# Patient Record
Sex: Female | Born: 1935 | Race: White | Hispanic: No | Marital: Married | Smoking: Never smoker
Health system: Southern US, Community
[De-identification: ages and names within clinical notes are randomized; demographics above are authoritative.]

## PROBLEM LIST (undated history)

## (undated) DIAGNOSIS — E119 Type 2 diabetes mellitus without complications: Secondary | ICD-10-CM

## (undated) DIAGNOSIS — I10 Essential (primary) hypertension: Secondary | ICD-10-CM

---

## 2014-02-11 ENCOUNTER — Encounter: Payer: Self-pay | Admitting: Emergency Medicine

## 2014-02-11 ENCOUNTER — Emergency Department
Admission: EM | Admit: 2014-02-11 | Discharge: 2014-02-11 | Disposition: A | Discharge status: Home / Self Care | Payer: 59 | Source: Home / Self Care | Attending: Family Medicine | Admitting: Family Medicine

## 2014-02-11 DIAGNOSIS — R21 Rash and other nonspecific skin eruption: Secondary | ICD-10-CM

## 2014-02-11 HISTORY — DX: Essential (primary) hypertension: I10

## 2014-02-11 HISTORY — DX: Type 2 diabetes mellitus without complications: E11.9

## 2014-02-11 MED ORDER — TRIAMCINOLONE ACETONIDE 0.1 % EX CREA: 1.0000 "application " | TOPICAL_CREAM | Freq: Two times a day (BID) | CUTANEOUS | Status: DC

## 2014-02-11 NOTE — ED Provider Notes (Signed)
Lindsay Young is a 80 y.o. female who presents to Urgent Care today for rash. Patient has a itchy burning rash on her face that's been present for the last 4 days. She denies any new soaps detergents shampoos or cosmetics. She denies any new medications. No mouth involvement. She's tried some over-the-counter creams and Benadryl which have not helped much. No fevers or chills nausea vomiting or diarrhea. She feels well otherwise.   Past Medical History  Diagnosis Date  . Hypertension   . Diabetes mellitus without complication    History  Substance Use Topics  . Smoking status: Never Smoker   . Smokeless tobacco: Never Used  . Alcohol Use: No   ROS as above Medications: No current facility-administered medications for this encounter.   Current Outpatient Prescriptions  Medication Sig Dispense Refill  . amLODipine-benazepril (LOTREL) 5-40 MG per capsule Take 1 capsule by mouth daily.      Marland Kitchen aspirin 81 MG tablet Take 81 mg by mouth daily.      . benazepril (LOTENSIN) 40 MG tablet Take 40 mg by mouth daily.      Marland Kitchen glimepiride (AMARYL) 4 MG tablet Take 4 mg by mouth daily with breakfast.      . simvastatin (ZOCOR) 10 MG tablet Take 10 mg by mouth daily.      Marland Kitchen UNKNOWN TO PATIENT 12 Units. Insulin      . triamcinolone cream (KENALOG) 0.1 % Apply 1 application topically 2 (two) times daily.  30 g  0    Exam:  BP 134/76  Pulse 97  Ht 5\' 9"  (1.753 m)  Wt 152 lb 8 oz (69.174 kg)  BMI 22.51 kg/m2  SpO2 94% Gen: Well NAD HEENT: EOMI,  MMM Lungs: Normal work of breathing. CTABL Heart: RRR no MRG Abd: NABS, Soft. Nondistended, Nontender Exts: Brisk capillary refill, warm and well perfused.  Skin: Erythematous small raised area on face. Appears to be maculopapular. Nontender. Blanchable.   No results found for this or any previous visit (from the past 24 hour(s)). No results found.  Assessment and Plan: 80 y.o. female with rash. Likely contact dermatitis. Plan to treat with  triamcinolone cream and oral antihistamine. If not better followup with primary care provider.  Discussed warning signs or symptoms. Please see discharge instructions. Patient expresses understanding.   This note was created using Systems analyst. Any transcription errors are unintended.    Gregor Hams, MD 02/11/14 [phone]

## 2014-02-11 NOTE — ED Notes (Signed)
Pt complains of red welts on her face that started on Wed.  They have gotten worse and they itch and burn.  Has tried Benadryl with no relief

## 2014-02-11 NOTE — Discharge Instructions (Signed)
Thank you for coming in today. Please take over-the-counter Zyrtec (cetirizine) daily Use triamcinolone cream for the rash twice daily into the skin looks better Followup with your primary care Dr. Jerl Santos back as needed Call or go to the emergency room if you get worse, have trouble breathing, have chest pains, or palpitations.    Contact Dermatitis Contact dermatitis is a reaction to certain substances that touch the skin. Contact dermatitis can be either irritant contact dermatitis or allergic contact dermatitis. Irritant contact dermatitis does not require previous exposure to the substance for a reaction to occur.Allergic contact dermatitis only occurs if you have been exposed to the substance before. Upon a repeat exposure, your body reacts to the substance.  CAUSES  Many substances can cause contact dermatitis. Irritant dermatitis is most commonly caused by repeated exposure to mildly irritating substances, such as:  Makeup.  Soaps.  Detergents.  Bleaches.  Acids.  Metal salts, such as nickel. Allergic contact dermatitis is most commonly caused by exposure to:  Poisonous plants.  Chemicals (deodorants, shampoos).  Jewelry.  Latex.  Neomycin in triple antibiotic cream.  Preservatives in products, including clothing. SYMPTOMS  The area of skin that is exposed may develop:  Dryness or flaking.  Redness.  Cracks.  Itching.  Pain or a burning sensation.  Blisters. With allergic contact dermatitis, there may also be swelling in areas such as the eyelids, mouth, or genitals.  DIAGNOSIS  Your caregiver can usually tell what the problem is by doing a physical exam. In cases where the cause is uncertain and an allergic contact dermatitis is suspected, a patch skin test may be performed to help determine the cause of your dermatitis. TREATMENT Treatment includes protecting the skin from further contact with the irritating substance by avoiding that substance if  possible. Barrier creams, powders, and gloves may be helpful. Your caregiver may also recommend:  Steroid creams or ointments applied 2 times daily. For best results, soak the rash area in cool water for 20 minutes. Then apply the medicine. Cover the area with a plastic wrap. You can store the steroid cream in the refrigerator for a "chilly" effect on your rash. That may decrease itching. Oral steroid medicines may be needed in more severe cases.  Antibiotics or antibacterial ointments if a skin infection is present.  Antihistamine lotion or an antihistamine taken by mouth to ease itching.  Lubricants to keep moisture in your skin.  Burow's solution to reduce redness and soreness or to dry a weeping rash. Mix one packet or tablet of solution in 2 cups cool water. Dip a clean washcloth in the mixture, wring it out a bit, and put it on the affected area. Leave the cloth in place for 30 minutes. Do this as often as possible throughout the day.  Taking several cornstarch or baking soda baths daily if the area is too large to cover with a washcloth. Harsh chemicals, such as alkalis or acids, can cause skin damage that is like a burn. You should flush your skin for 15 to 20 minutes with cold water after such an exposure. You should also seek immediate medical care after exposure. Bandages (dressings), antibiotics, and pain medicine may be needed for severely irritated skin.  HOME CARE INSTRUCTIONS  Avoid the substance that caused your reaction.  Keep the area of skin that is affected away from hot water, soap, sunlight, chemicals, acidic substances, or anything else that would irritate your skin.  Do not scratch the rash. Scratching may cause the  rash to become infected.  You may take cool baths to help stop the itching.  Only take over-the-counter or prescription medicines as directed by your caregiver.  See your caregiver for follow-up care as directed to make sure your skin is healing  properly. SEEK MEDICAL CARE IF:   Your condition is not better after 3 days of treatment.  You seem to be getting worse.  You see signs of infection such as swelling, tenderness, redness, soreness, or warmth in the affected area.  You have any problems related to your medicines. Document Released: 05/30/2000 Document Revised: 08/25/2011 Document Reviewed: 11/05/2010 [facility] Patient Information 2015 [place], [place]. This information is not intended to replace advice given to you by your health care provider. Make sure you discuss any questions you have with your health care provider.

## 2014-05-15 ENCOUNTER — Ambulatory Visit (INDEPENDENT_AMBULATORY_CARE_PROVIDER_SITE_OTHER): Payer: 59 | Admitting: Sports Medicine

## 2014-05-15 ENCOUNTER — Encounter: Payer: Self-pay | Admitting: Emergency Medicine

## 2014-05-15 ENCOUNTER — Emergency Department (INDEPENDENT_AMBULATORY_CARE_PROVIDER_SITE_OTHER): Payer: 59

## 2014-05-15 ENCOUNTER — Emergency Department
Admission: EM | Admit: 2014-05-15 | Discharge: 2014-05-15 | Disposition: A | Discharge status: Home / Self Care | Payer: 59 | Source: Home / Self Care | Attending: Family Medicine | Admitting: Family Medicine

## 2014-05-15 DIAGNOSIS — M25561 Pain in right knee: Secondary | ICD-10-CM

## 2014-05-15 DIAGNOSIS — W19XXXA Unspecified fall, initial encounter: Secondary | ICD-10-CM

## 2014-05-15 DIAGNOSIS — M25511 Pain in right shoulder: Secondary | ICD-10-CM

## 2014-05-15 DIAGNOSIS — M1711 Unilateral primary osteoarthritis, right knee: Secondary | ICD-10-CM | POA: Insufficient documentation

## 2014-05-15 DIAGNOSIS — S62307A Unspecified fracture of fifth metacarpal bone, left hand, initial encounter for closed fracture: Secondary | ICD-10-CM

## 2014-05-15 DIAGNOSIS — S62303A Unspecified fracture of third metacarpal bone, left hand, initial encounter for closed fracture: Secondary | ICD-10-CM

## 2014-05-15 DIAGNOSIS — S62327A Displaced fracture of shaft of fifth metacarpal bone, left hand, initial encounter for closed fracture: Secondary | ICD-10-CM

## 2014-05-15 DIAGNOSIS — S62325A Displaced fracture of shaft of fourth metacarpal bone, left hand, initial encounter for closed fracture: Secondary | ICD-10-CM

## 2014-05-15 DIAGNOSIS — S43401A Unspecified sprain of right shoulder joint, initial encounter: Secondary | ICD-10-CM

## 2014-05-15 DIAGNOSIS — S62309A Unspecified fracture of unspecified metacarpal bone, initial encounter for closed fracture: Secondary | ICD-10-CM | POA: Insufficient documentation

## 2014-05-15 DIAGNOSIS — S62305A Unspecified fracture of fourth metacarpal bone, left hand, initial encounter for closed fracture: Secondary | ICD-10-CM

## 2014-05-15 DIAGNOSIS — S62323A Displaced fracture of shaft of third metacarpal bone, left hand, initial encounter for closed fracture: Secondary | ICD-10-CM

## 2014-05-15 MED ORDER — OXYCODONE-ACETAMINOPHEN 5-325 MG PO TABS: 1.0000 | ORAL_TABLET | Freq: Three times a day (TID) | ORAL | Status: DC | PRN

## 2014-05-15 NOTE — Progress Notes (Signed)
   Subjective:    I'm seeing this patient as a consultation for:  Dr. Assunta Found  CC: Left hand injury  HPI: This is a very pleasant 80 year old female, unfortunately she was knocked over by accident and fell onto an outstretched left hand. She injured her right shoulder, x-rays were negative, she also injured her right knee which showed pre-existing arthritis on the x-rays but no new fractures. Most importantly she did end up with fractures, spiral through the third, fourth, and fifth metacarpal shafts. I was called for further evaluation and definitive treatment. She does have a ring on her fourth finger which needs to be removed as the swelling is worsening. Pain is severe, persistent.  Past medical history, Surgical history, Family history not pertinant except as noted below, Social history, Allergies, and medications have been entered into the medical record, reviewed, and no changes needed.   Review of Systems: No headache, visual changes, nausea, vomiting, diarrhea, constipation, dizziness, abdominal pain, skin rash, fevers, chills, night sweats, weight loss, swollen lymph nodes, body aches, joint swelling, muscle aches, chest pain, shortness of breath, mood changes, visual or auditory hallucinations.   Objective:   General: Well Developed, well nourished, and in no acute distress.  Neuro/Psych: Alert and oriented x3, extra-ocular muscles intact, able to move all 4 extremities, sensation grossly intact. Skin: Warm and dry, no rashes noted.  Respiratory: Not using accessory muscles, speaking in full sentences, trachea midline.  Cardiovascular: Pulses palpable, no extremity edema. Abdomen: Does not appear distended. Right shoulder: Limited abduction suggestive of rotator cuff tear. Left hand: Swollen, bruised with tenderness over the dorsum and volar aspect. Neurovascularly intact distally.    Ring removal procedure:  After applying lubricant and wrapping 4-0 suture material around her ring  finger I was able to pull the suture material under her ring with a curved hemostat, then using gentle axial traction and pulling on the suture material I was able to remove the ring.  X-rays personally reviewed and shows spiral fractures that are non-angulated and only minimally displaced through the third, fourth, and fifth metacarpal shafts.  I placed an ulnar gutter splint.  Impression and Recommendations:   This case required medical decision making of moderate complexity.

## 2014-05-15 NOTE — Assessment & Plan Note (Signed)
Possible rotator cuff injury. We will evaluate this further at her next visit.

## 2014-05-15 NOTE — ED Provider Notes (Signed)
CSN: [id number]     Arrival date & time 05/15/14  1019 History   First MD Initiated Contact with Patient 05/15/14 1121     Chief Complaint  Patient presents with  . Fall      HPI Comments: At 3pm yesterday, patient fell, breaking her fall with her hands.  She also landed on her right shoulder and right knee.  She has had persistent pain and swelling in her left hand, and pain and decreased range of motion in her right shoulder.  She has had minimal discomfort in her right knee.  Patient is a 80 y.o. female presenting with hand injury. The history is provided by the patient.  Hand Injury Location:  Hand Time since incident:  1 day Injury: yes   Mechanism of injury: fall   Fall:    Impact surface:  Hard floor   Point of impact:  Hands (right shoulder and right knee)   Entrapped after fall: no   Hand location:  L hand Pain details:    Quality:  Aching   Radiates to:  Does not radiate   Severity:  Severe   Onset quality:  Sudden   Duration:  1 day   Timing:  Constant   Progression:  Unchanged Chronicity:  New Dislocation: no   Prior injury to area:  No Relieved by:  Nothing Exacerbated by: grasping with left hand and moving right shoulder. Ineffective treatments:  NSAIDs Associated symptoms: decreased range of motion, stiffness and swelling   Associated symptoms: no back pain, no muscle weakness, no neck pain, no numbness and no tingling     Past Medical History  Diagnosis Date  . Hypertension   . Diabetes mellitus without complication    History reviewed. No pertinent past surgical history. No family history on file. History  Substance Use Topics  . Smoking status: Never Smoker   . Smokeless tobacco: Never Used  . Alcohol Use: No   OB History    No data available     Review of Systems  Musculoskeletal: Positive for stiffness. Negative for back pain and neck pain.  All other systems reviewed and are negative.   Allergies  Hydrocodone; Keflex; and  Sulfanilamide  Home Medications   Prior to Admission medications   Medication Sig Start Date End Date Taking? Authorizing Provider  amLODipine-benazepril (LOTREL) 5-40 MG per capsule Take 1 capsule by mouth daily.    Historical Provider, MD  aspirin 81 MG tablet Take 81 mg by mouth daily.    Historical Provider, MD  benazepril (LOTENSIN) 40 MG tablet Take 40 mg by mouth daily.    Historical Provider, MD  glimepiride (AMARYL) 4 MG tablet Take 4 mg by mouth daily with breakfast.    Historical Provider, MD  oxyCODONE-acetaminophen (PERCOCET/ROXICET) 5-325 MG per tablet Take 1 tablet by mouth every 8 (eight) hours as needed. 05/15/14   Silverio Decamp, MD  simvastatin (ZOCOR) 10 MG tablet Take 10 mg by mouth daily.    Historical Provider, MD  triamcinolone cream (KENALOG) 0.1 % Apply 1 application topically 2 (two) times daily. 02/11/14   Gregor Hams, MD  UNKNOWN TO PATIENT 12 Units. Insulin    Historical Provider, MD   BP 116/71 mmHg  Pulse 88  Temp(Src) 98 F (36.7 C) (Oral)  Ht 5\' 10"  (1.778 m)  Wt 154 lb (69.854 kg)  BMI 22.10 kg/m2  SpO2 97% Physical Exam  Constitutional: She is oriented to person, place, and time. She appears well-developed and well-nourished.  No distress.  HENT:  Head: Atraumatic.  Eyes: Conjunctivae are normal. Pupils are equal, round, and reactive to light.  Neck: Normal range of motion.  Cardiovascular: Normal heart sounds.   Pulmonary/Chest: Breath sounds normal.  Abdominal: There is no tenderness.  Musculoskeletal:       Right shoulder: She exhibits decreased range of motion, tenderness, bony tenderness, pain and decreased strength. She exhibits no swelling, no effusion, no crepitus, no deformity, no laceration and normal pulse.       Right knee: She exhibits swelling, ecchymosis and bony tenderness. She exhibits normal range of motion, no deformity, no laceration, no erythema, normal alignment, no LCL laxity and normal patellar mobility. Tenderness  found. Patellar tendon tenderness noted.       Arms:      Right hand: She exhibits decreased range of motion, tenderness, bony tenderness and swelling. She exhibits normal two-point discrimination, normal capillary refill and no laceration. Normal sensation noted. Decreased strength noted.       Hands: Unable to abduct right shoulder more than 45 degrees from vertical.  Decreased external rotation and strength.  Positive Apley's test.  Negative empty can.  Right knee has mild tenderness over patella and patellar tendon.  Eccymosis present below patella medially.  Knee stable (negative drawer).  Negative McMurray test.  Left hand has diffuse swelling and tenderness dorsally as noted on diagram.  There is also ecchymosis and tenderness in the palm of the left hand.    Neurological: She is alert and oriented to person, place, and time.  Skin: Skin is warm and dry.  Nursing note and vitals reviewed.   ED Course  Procedures   none   Imaging Review   Imaging Results       DG Shoulder Right (Final result) Result time: 05/15/14 12:34:23   Final result by Rad Results In Interface (05/15/14 12:34:23)   Narrative:   CLINICAL DATA: Status post fall 1 day ago. Anterior and lateral right shoulder pain. Initial encounter.  EXAM: RIGHT SHOULDER - 2+ VIEW  COMPARISON: None.  FINDINGS: There is no acute bony or joint abnormality. No notable degenerative change is seen.  IMPRESSION: Negative right shoulder.   Electronically Signed By: Inge Rise M.D. On: 05/15/2014 12:34          DG Hand Complete Left (Final result) Result time: 05/15/14 12:37:34   Final result by Rad Results In Interface (05/15/14 12:37:34)   Narrative:   CLINICAL DATA: Left hand pain with bruising and swelling over the second through fifth metacarpals status post fall yesterday.  EXAM: LEFT HAND - COMPLETE 3+ VIEW  COMPARISON: None.  FINDINGS: The patient has sustained spiral  fractures of the third, fourth, and fifth metacarpals. There is mild displacement of the fourth metacarpal shaft fracture. The first and second metacarpals are intact. The phalanges are intact as are the carpal bones and visualized portions of the distal radius and ulna. There is extensive soft tissue swelling over the metacarpals. There is narrowing of the joint spaces diffusely consistent with mild osteoarthritis. There is moderate degenerative change of the first carpometacarpal joint.  IMPRESSION: There are acute spiral fractures of the shafts of the third, fourth, and fifth metacarpals.   Electronically Signed By: David Martinique On: 05/15/2014 12:37          DG Knee Complete 4 Views Right (Final result) Result time: 05/15/14 12:38:32   Final result by Rad Results In Interface (05/15/14 12:38:32)   Narrative:   CLINICAL DATA: Anterior knee pain  status post fall yesterday  EXAM: RIGHT KNEE - COMPLETE 4+ VIEW  COMPARISON: None.  FINDINGS: The bones of the right knee are adequately mineralized. There is mild narrowing of the medial joint compartment. The femoral condyles and the tibial plateaus are intact. Are small spurs from the superior and inferior margins of the patella. There is no joint effusion. There is calcification in the wall of the popliteal artery.  IMPRESSION: There is no acute bony abnormality of the right knee.   Electronically Signed By: David Martinique On: 05/15/2014 12:38      MDM   1. Fracture of fifth metacarpal bone of left hand, closed, initial encounter   2. Right anterior knee pain   3. Fracture of fourth metacarpal bone of left hand, closed, initial encounter   4. Fracture of third metacarpal bone of left hand, closed, initial encounter   5. Shoulder sprain, right, initial encounter    Will refer to Dr. Aundria Mems for definitive fracture management, and management of shoulder injury (suspect rotator cuff  injury)    Kandra Nicolas, MD 05/18/14 [id number]

## 2014-05-15 NOTE — Assessment & Plan Note (Signed)
Ulnar gutter splint placed. Low-dose oxycodone for pain.  We had to perform a ring removal. Return in one week, repeat x-rays.  I billed a fracture code for this encounter, all subsequent visits will be post-op checks in the global period.

## 2014-05-15 NOTE — Assessment & Plan Note (Signed)
We can evaluate this further at future visits.
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
## 2014-05-18 NOTE — Discharge Instructions (Signed)
Ensure adequate Vitamin D and calcium intake

## 2014-05-22 ENCOUNTER — Ambulatory Visit (INDEPENDENT_AMBULATORY_CARE_PROVIDER_SITE_OTHER): Payer: 59 | Admitting: Sports Medicine

## 2014-05-22 ENCOUNTER — Encounter: Payer: Self-pay | Admitting: Sports Medicine

## 2014-05-22 ENCOUNTER — Ambulatory Visit (INDEPENDENT_AMBULATORY_CARE_PROVIDER_SITE_OTHER): Payer: 59

## 2014-05-22 VITALS — BP 134/66 | HR 94 | Ht 68.0 in | Wt 155.0 lb

## 2014-05-22 DIAGNOSIS — S62307D Unspecified fracture of fifth metacarpal bone, left hand, subsequent encounter for fracture with routine healing: Secondary | ICD-10-CM

## 2014-05-22 DIAGNOSIS — S62303D Unspecified fracture of third metacarpal bone, left hand, subsequent encounter for fracture with routine healing: Secondary | ICD-10-CM

## 2014-05-22 DIAGNOSIS — S62309D Unspecified fracture of unspecified metacarpal bone, subsequent encounter for fracture with routine healing: Secondary | ICD-10-CM

## 2014-05-22 DIAGNOSIS — S62305D Unspecified fracture of fourth metacarpal bone, left hand, subsequent encounter for fracture with routine healing: Secondary | ICD-10-CM

## 2014-05-22 DIAGNOSIS — S62309A Unspecified fracture of unspecified metacarpal bone, initial encounter for closed fracture: Secondary | ICD-10-CM

## 2014-05-22 NOTE — Assessment & Plan Note (Signed)
1 week post fracture. New ulnar gutter splint placed. Return in one week, x-ray before visit, if swelling has improved we will place a cast.

## 2014-05-22 NOTE — Progress Notes (Signed)
  Subjective: 1 week post fractures of the left third, fourth, and fifth metacarpals. Doing well in ulnar gutter splint.   Objective: General: Well-developed, well-nourished, and in no acute distress. Left hand: Continues to be swollen and bruised, neurovascularly intact distally, obvious pain over the metacarpal shaft.  New ulnar gutter splint placed today.  Assessment/plan:

## 2014-05-26 ENCOUNTER — Encounter: Payer: Self-pay | Admitting: Sports Medicine

## 2014-05-26 ENCOUNTER — Ambulatory Visit (INDEPENDENT_AMBULATORY_CARE_PROVIDER_SITE_OTHER): Payer: 59 | Admitting: Sports Medicine

## 2014-05-26 ENCOUNTER — Ambulatory Visit (INDEPENDENT_AMBULATORY_CARE_PROVIDER_SITE_OTHER): Payer: 59

## 2014-05-26 VITALS — BP 123/62 | HR 67 | Wt 153.0 lb

## 2014-05-26 DIAGNOSIS — S62307D Unspecified fracture of fifth metacarpal bone, left hand, subsequent encounter for fracture with routine healing: Secondary | ICD-10-CM

## 2014-05-26 DIAGNOSIS — S62309D Unspecified fracture of unspecified metacarpal bone, subsequent encounter for fracture with routine healing: Secondary | ICD-10-CM

## 2014-05-26 DIAGNOSIS — S62305D Unspecified fracture of fourth metacarpal bone, left hand, subsequent encounter for fracture with routine healing: Secondary | ICD-10-CM

## 2014-05-26 DIAGNOSIS — S62303D Unspecified fracture of third metacarpal bone, left hand, subsequent encounter for fracture with routine healing: Secondary | ICD-10-CM

## 2014-05-26 MED ORDER — OXYCODONE-ACETAMINOPHEN 10-325 MG PO TABS: 1.0000 | ORAL_TABLET | Freq: Three times a day (TID) | ORAL | Status: DC | PRN

## 2014-05-26 NOTE — Progress Notes (Signed)
  Subjective: This is a very pleasant 80 year old female who is 1.5 weeks post fractures of the left third, fourth, and fifth metacarpals. Unfortunately she is unable to tolerate her ulnar gutter splint  Objective: General: Well-developed, well-nourished, and in no acute distress. Left hand: Splint is removed, she still has some swelling over the thenar eminence however swelling over the mid hand is minimal. There is still some bruising, and she is neurovascularly intact distally.  Assessment/plan:

## 2014-05-26 NOTE — Assessment & Plan Note (Signed)
Doing well with fracture.  Switched to a Velcro wrist brace that she was unable to tolerate the ulnar gutter splint, and she is not yet ready for a cast. Return to see me in 1.5 weeks.

## 2014-06-06 ENCOUNTER — Ambulatory Visit (INDEPENDENT_AMBULATORY_CARE_PROVIDER_SITE_OTHER): Payer: 59 | Admitting: Sports Medicine

## 2014-06-06 ENCOUNTER — Encounter: Payer: Self-pay | Admitting: Sports Medicine

## 2014-06-06 DIAGNOSIS — S62309D Unspecified fracture of unspecified metacarpal bone, subsequent encounter for fracture with routine healing: Secondary | ICD-10-CM

## 2014-06-06 NOTE — Progress Notes (Signed)
  Subjective: Lindsay Young he returns, she is almost 4 weeks post fractures of her left third, fourth, and fifth metacarpals, she is doing well in a simple Velcro brace.   Objective: General: Well-developed, well-nourished, and in no acute distress. Left hand: Tender to palpation over the fracture site as expected, she is neurovascularly intact distally.  Assessment/plan:

## 2014-06-06 NOTE — Assessment & Plan Note (Signed)
Continues to do well after fracture.  I do anticipate a solid 6-8 weeks for healing. Follow up in 2 weeks, x-ray before visit.

## 2014-06-20 ENCOUNTER — Ambulatory Visit (INDEPENDENT_AMBULATORY_CARE_PROVIDER_SITE_OTHER): Payer: Medicare Other

## 2014-06-20 ENCOUNTER — Encounter: Payer: Self-pay | Admitting: Sports Medicine

## 2014-06-20 ENCOUNTER — Ambulatory Visit (INDEPENDENT_AMBULATORY_CARE_PROVIDER_SITE_OTHER): Payer: Medicare Other | Admitting: Sports Medicine

## 2014-06-20 DIAGNOSIS — S62607D Fracture of unspecified phalanx of left little finger, subsequent encounter for fracture with routine healing: Secondary | ICD-10-CM

## 2014-06-20 DIAGNOSIS — S62309D Unspecified fracture of unspecified metacarpal bone, subsequent encounter for fracture with routine healing: Secondary | ICD-10-CM

## 2014-06-20 DIAGNOSIS — S62603D Fracture of unspecified phalanx of left middle finger, subsequent encounter for fracture with routine healing: Secondary | ICD-10-CM

## 2014-06-20 DIAGNOSIS — S62605D Fracture of unspecified phalanx of left ring finger, subsequent encounter for fracture with routine healing: Secondary | ICD-10-CM

## 2014-06-20 NOTE — Progress Notes (Signed)
  Subjective: 6 weeks post left third, fourth, and fifth spiral metacarpal fractures. Doing significantly better than before. Still has moderate pain at the fracture site.  Objective: General: Well-developed, well-nourished, and in no acute distress. Left hand: Neurovascularly intact distally, minimal tenderness to palpation of the fracture site, good movement.  X-rays continue to show stability of the fractures.  Assessment/plan:

## 2014-06-20 NOTE — Assessment & Plan Note (Signed)
Fractures are proceeding as expected. We are going to start hand therapy for passive range of motion. Discontinue brace.

## 2014-06-27 ENCOUNTER — Ambulatory Visit (INDEPENDENT_AMBULATORY_CARE_PROVIDER_SITE_OTHER): Payer: Medicare Other | Admitting: Physical Therapy

## 2014-06-27 DIAGNOSIS — S62309D Unspecified fracture of unspecified metacarpal bone, subsequent encounter for fracture with routine healing: Secondary | ICD-10-CM

## 2014-06-27 DIAGNOSIS — R5381 Other malaise: Secondary | ICD-10-CM

## 2014-06-27 DIAGNOSIS — M2550 Pain in unspecified joint: Secondary | ICD-10-CM

## 2014-06-27 DIAGNOSIS — M2560 Stiffness of unspecified joint, not elsewhere classified: Secondary | ICD-10-CM

## 2014-06-29 ENCOUNTER — Encounter (INDEPENDENT_AMBULATORY_CARE_PROVIDER_SITE_OTHER): Payer: Medicare Other | Admitting: Physical Therapy

## 2014-06-29 DIAGNOSIS — S62309D Unspecified fracture of unspecified metacarpal bone, subsequent encounter for fracture with routine healing: Secondary | ICD-10-CM

## 2014-06-29 DIAGNOSIS — M2560 Stiffness of unspecified joint, not elsewhere classified: Secondary | ICD-10-CM

## 2014-06-29 DIAGNOSIS — M2550 Pain in unspecified joint: Secondary | ICD-10-CM

## 2014-06-29 DIAGNOSIS — R5381 Other malaise: Secondary | ICD-10-CM

## 2014-07-03 ENCOUNTER — Encounter (INDEPENDENT_AMBULATORY_CARE_PROVIDER_SITE_OTHER): Payer: Medicare Other | Admitting: Physical Therapy

## 2014-07-03 DIAGNOSIS — R5381 Other malaise: Secondary | ICD-10-CM

## 2014-07-03 DIAGNOSIS — M2550 Pain in unspecified joint: Secondary | ICD-10-CM

## 2014-07-03 DIAGNOSIS — M2560 Stiffness of unspecified joint, not elsewhere classified: Secondary | ICD-10-CM

## 2014-07-03 DIAGNOSIS — S62309D Unspecified fracture of unspecified metacarpal bone, subsequent encounter for fracture with routine healing: Secondary | ICD-10-CM

## 2014-07-05 ENCOUNTER — Encounter: Payer: Medicare Other | Admitting: Physical Therapy

## 2014-07-06 ENCOUNTER — Encounter (INDEPENDENT_AMBULATORY_CARE_PROVIDER_SITE_OTHER): Payer: Medicare Other | Admitting: Physical Therapy

## 2014-07-06 DIAGNOSIS — M2560 Stiffness of unspecified joint, not elsewhere classified: Secondary | ICD-10-CM

## 2014-07-06 DIAGNOSIS — S62309D Unspecified fracture of unspecified metacarpal bone, subsequent encounter for fracture with routine healing: Secondary | ICD-10-CM

## 2014-07-06 DIAGNOSIS — M2550 Pain in unspecified joint: Secondary | ICD-10-CM

## 2014-07-06 DIAGNOSIS — R5381 Other malaise: Secondary | ICD-10-CM

## 2014-07-07 ENCOUNTER — Encounter: Payer: Medicare Other | Admitting: Physical Therapy

## 2014-07-11 ENCOUNTER — Encounter: Payer: Self-pay | Admitting: Sports Medicine

## 2014-07-11 ENCOUNTER — Ambulatory Visit (INDEPENDENT_AMBULATORY_CARE_PROVIDER_SITE_OTHER): Payer: Medicare Other | Admitting: Sports Medicine

## 2014-07-11 ENCOUNTER — Encounter (INDEPENDENT_AMBULATORY_CARE_PROVIDER_SITE_OTHER): Payer: Medicare Other | Admitting: Physical Therapy

## 2014-07-11 ENCOUNTER — Ambulatory Visit (INDEPENDENT_AMBULATORY_CARE_PROVIDER_SITE_OTHER): Payer: Medicare Other

## 2014-07-11 VITALS — BP 117/58 | HR 81 | Wt 150.0 lb

## 2014-07-11 DIAGNOSIS — R5381 Other malaise: Secondary | ICD-10-CM

## 2014-07-11 DIAGNOSIS — S62309D Unspecified fracture of unspecified metacarpal bone, subsequent encounter for fracture with routine healing: Secondary | ICD-10-CM

## 2014-07-11 DIAGNOSIS — M2550 Pain in unspecified joint: Secondary | ICD-10-CM

## 2014-07-11 DIAGNOSIS — M19042 Primary osteoarthritis, left hand: Secondary | ICD-10-CM

## 2014-07-11 DIAGNOSIS — S62323D Displaced fracture of shaft of third metacarpal bone, left hand, subsequent encounter for fracture with routine healing: Secondary | ICD-10-CM

## 2014-07-11 DIAGNOSIS — M2560 Stiffness of unspecified joint, not elsewhere classified: Secondary | ICD-10-CM

## 2014-07-11 NOTE — Progress Notes (Signed)
  Subjective:    CC: Follow-up  HPI: Metacarpal fractures: Lindsay Young is about 11-12 weeks post fractures of the left third, fourth, and fifth metacarpals, she has healed well, and is currently regaining motion in therapy.  Second metacarpal phalangeal joint pain: Moderate, persistent, osteoarthritis on x-rays. Pain has been persistent and not improved with immobilization. No radiation.  Past medical history, Surgical history, Family history not pertinant except as noted below, Social history, Allergies, and medications have been entered into the medical record, reviewed, and no changes needed.   Review of Systems: No fevers, chills, night sweats, weight loss, chest pain, or shortness of breath.   Objective:    General: Well Developed, well nourished, and in no acute distress.  Neuro: Alert and oriented x3, extra-ocular muscles intact, sensation grossly intact.  HEENT: Normocephalic, atraumatic, pupils equal round reactive to light, neck supple, no masses, no lymphadenopathy, thyroid nonpalpable.  Skin: Warm and dry, no rashes. Cardiac: Regular rate and rhythm, no murmurs rubs or gallops, no lower extremity edema.  Respiratory: Clear to auscultation bilaterally. Not using accessory muscles, speaking in full sentences. Left hand: No longer tender to palpation of the fractures, she does have significant swelling and tenderness at the metacarpophalangeal joint, second.  X-rays reviewed and do show continued healing of the spiral fracture through the third, fourth, and fifth metacarpals without angulation or displacement.  Procedure: Real-time Ultrasound Guided Injection of left second metacarpal phalangeal joint Device: GE Logiq E  Verbal informed consent obtained.  Time-out conducted.  Noted no overlying erythema, induration, or other signs of local infection.  Skin prepped in a sterile fashion.  Local anesthesia: Topical Ethyl chloride.  With sterile technique and under real time  ultrasound guidance:  Using a 30-gauge needle I injected 0.5 mL kenalog 40, 0.5 mL lidocaine easily. Completed without difficulty  Pain immediately resolved suggesting accurate placement of the medication.  Advised to call if fevers/chills, erythema, induration, drainage, or persistent bleeding.  Images permanently stored and available for review in the ultrasound unit.  Impression: Technically successful ultrasound guided injection.  Impression and Recommendations:

## 2014-07-11 NOTE — Assessment & Plan Note (Signed)
Left second metacarpal phalangeal joint injection as above. Return in one month

## 2014-07-11 NOTE — Assessment & Plan Note (Signed)
Overall doing extremely well, no longer tender over the fractures. Continues to improve her range of motion with hand therapy.

## 2014-07-14 ENCOUNTER — Encounter (INDEPENDENT_AMBULATORY_CARE_PROVIDER_SITE_OTHER): Payer: Medicare Other | Admitting: Physical Therapy

## 2014-07-14 DIAGNOSIS — M2550 Pain in unspecified joint: Secondary | ICD-10-CM

## 2014-07-14 DIAGNOSIS — S62309D Unspecified fracture of unspecified metacarpal bone, subsequent encounter for fracture with routine healing: Secondary | ICD-10-CM

## 2014-07-14 DIAGNOSIS — R5381 Other malaise: Secondary | ICD-10-CM

## 2014-07-14 DIAGNOSIS — M2560 Stiffness of unspecified joint, not elsewhere classified: Secondary | ICD-10-CM

## 2014-07-18 ENCOUNTER — Encounter (INDEPENDENT_AMBULATORY_CARE_PROVIDER_SITE_OTHER): Payer: Medicare Other | Admitting: Physical Therapy

## 2014-07-18 DIAGNOSIS — S62309D Unspecified fracture of unspecified metacarpal bone, subsequent encounter for fracture with routine healing: Secondary | ICD-10-CM

## 2014-07-18 DIAGNOSIS — R5381 Other malaise: Secondary | ICD-10-CM

## 2014-07-18 DIAGNOSIS — M2550 Pain in unspecified joint: Secondary | ICD-10-CM

## 2014-07-18 DIAGNOSIS — M2560 Stiffness of unspecified joint, not elsewhere classified: Secondary | ICD-10-CM

## 2014-07-21 ENCOUNTER — Encounter (INDEPENDENT_AMBULATORY_CARE_PROVIDER_SITE_OTHER): Payer: Medicare Other | Admitting: Physical Therapy

## 2014-07-21 DIAGNOSIS — M2560 Stiffness of unspecified joint, not elsewhere classified: Secondary | ICD-10-CM

## 2014-07-21 DIAGNOSIS — M2550 Pain in unspecified joint: Secondary | ICD-10-CM

## 2014-07-21 DIAGNOSIS — R5381 Other malaise: Secondary | ICD-10-CM

## 2014-07-21 DIAGNOSIS — S62309D Unspecified fracture of unspecified metacarpal bone, subsequent encounter for fracture with routine healing: Secondary | ICD-10-CM

## 2014-07-25 ENCOUNTER — Encounter (INDEPENDENT_AMBULATORY_CARE_PROVIDER_SITE_OTHER): Payer: Medicare Other | Admitting: Physical Therapy

## 2014-07-25 DIAGNOSIS — S62309D Unspecified fracture of unspecified metacarpal bone, subsequent encounter for fracture with routine healing: Secondary | ICD-10-CM

## 2014-07-25 DIAGNOSIS — M2550 Pain in unspecified joint: Secondary | ICD-10-CM

## 2014-07-25 DIAGNOSIS — R5381 Other malaise: Secondary | ICD-10-CM

## 2014-07-25 DIAGNOSIS — M2560 Stiffness of unspecified joint, not elsewhere classified: Secondary | ICD-10-CM

## 2014-07-27 ENCOUNTER — Encounter: Payer: Medicare Other | Admitting: Physical Therapy

## 2014-07-28 ENCOUNTER — Encounter: Payer: Medicare Other | Admitting: Physical Therapy

## 2014-08-01 ENCOUNTER — Ambulatory Visit (INDEPENDENT_AMBULATORY_CARE_PROVIDER_SITE_OTHER): Payer: Medicare Other | Admitting: Physical Therapy

## 2014-08-01 DIAGNOSIS — Z789 Other specified health status: Secondary | ICD-10-CM

## 2014-08-01 DIAGNOSIS — M2560 Stiffness of unspecified joint, not elsewhere classified: Secondary | ICD-10-CM

## 2014-08-01 DIAGNOSIS — M2550 Pain in unspecified joint: Secondary | ICD-10-CM

## 2014-08-01 DIAGNOSIS — IMO0001 Reserved for inherently not codable concepts without codable children: Secondary | ICD-10-CM

## 2014-08-01 NOTE — Therapy (Signed)
[place], [place], [address] Phone: [phone]   Fax:  [phone]  Physical Therapy Treatment  Patient Details  Name: Lindsay Young MRN: [id number] Date of Birth: [birth year] Referring Provider:  Silverio Decamp,*  Encounter Date: 08/01/2014      PT End of Session - 08/01/14 1143    Visit Number 11   Number of Visits 12   Date for PT Re-Evaluation 08/08/14   PT Start Time 1100   PT Stop Time 1140   PT Time Calculation (min) 40 min   Equipment Utilized During Treatment --  Ultrasound       Past Medical History  Diagnosis Date  . Hypertension   . Diabetes mellitus without complication     No past surgical history on file.  There were no vitals taken for this visit.  Visit Diagnosis:  Pain in joint involving multiple sites  Stiffness of joints, multiple sites  Activity of daily living alteration      Subjective Assessment - 08/01/14 1112    Symptoms soreness and tightness always   Currently in Pain? Yes   Pain Score 1    Pain Location Finger (Comment which one)   Pain Orientation Left   Pain Descriptors / Indicators Dull   Pain Type Chronic pain   Pain Onset More than a month ago   Aggravating Factors  ther ex and certain movements   Pain Relieving Factors rest, NSAIDS, Arnica Cream                    [facility] Adult PT Treatment/Exercise - 08/01/14 0001    Exercises   Exercises Wrist   Wrist Exercises   Bar Weights/Barbell (Forearm Supination) 2 lbs   Forearm Pronation 10 reps   Bar Weights/Barbell (Forearm Pronation) 2 lbs   Wrist Flexion 10 reps   Bar Weights/Barbell (Wrist Flexion) 1 lb   Wrist Extension 10 reps   Bar Weights/Barbell (Wrist Extension) 2 lbs   Bar Weights/Barbell (Radial Deviation) 2 lbs   Wrist Ulnar Deviation 10 reps   Bar Weights/Barbell (Ulnar Deviation) 2 lbs   Modalities   Modalities Ultrasound   Ultrasound   Ultrasound  Location 4 digits execept thumb   Ultrasound Parameters 0.5 wcm2,8 min. 50%, 3.3 MHZ   Manual Therapy   Manual Therapy Passive ROM   Passive ROM stretching each digit except thumb into flexion and extension                     PT Long Term Goals - 08/01/14 1148    PT LONG TERM GOAL #1   Title demonstate and / or verbalize techniques to reduce the risk of re-injury to include infor on anit -inflammatory (RICE method). Met 08/01/14   PT LONG TERM GOAL #2   Title be independent with advanced HEP for strength and ROM met 08/01/14   PT LONG TERM GOAL #3   Title report pain decrease to 1/10 or less with ADLS met 10/21/30   PT LONG TERM GOAL #4   Title demo left hand ROM WFL to don/doff clothing met 08/01/14   PT LONG TERM GOAL #5   Title improve FOTO =/< 37% limited   Additional Long Term Goals   Additional Long Term Goals Yes   PT LONG TERM GOAL #6   Title hold onto to treadmill safely while walking               Plan -  08/01/14 1145    Clinical Impression Statement Patient doing well with pain decreasing and increased ROM for writst flexion, wrist extension is progressing slowly   PT Frequency 2x / week   PT Duration 6 weeks   PT Next Visit Plan --  ROM, Assess for renewal or D/C        Problem List Patient Active Problem List   Diagnosis Date Noted  . Osteoarthritis of hand, left 07/11/2014  . Osteoarthritis of right knee 05/15/2014  . Right shoulder pain 05/15/2014  . Closed fracture of third, fourth, and fifth metacarpals of left hand 05/15/2014     Natividad Brood, PTA 08/01/2014, 12:05 PM  [facility] [place], [place], [address] Phone: [phone]   Fax:  [phone]

## 2014-08-07 ENCOUNTER — Ambulatory Visit (INDEPENDENT_AMBULATORY_CARE_PROVIDER_SITE_OTHER): Payer: Medicare Other | Admitting: Physical Therapy

## 2014-08-07 DIAGNOSIS — M2560 Stiffness of unspecified joint, not elsewhere classified: Secondary | ICD-10-CM

## 2014-08-07 DIAGNOSIS — Z789 Other specified health status: Secondary | ICD-10-CM

## 2014-08-07 DIAGNOSIS — IMO0001 Reserved for inherently not codable concepts without codable children: Secondary | ICD-10-CM

## 2014-08-07 DIAGNOSIS — M2550 Pain in unspecified joint: Secondary | ICD-10-CM

## 2014-08-07 NOTE — Therapy (Signed)
[place], [place], [address] Phone: [phone]   Fax:  [phone]  Physical Therapy Treatment  Patient Details  Name: Lindsay Young MRN: [id number] Date of Birth: 05/10/1931 Referring Provider:  Silverio Decamp,*  Encounter Date: 08/07/2014      PT End of Session - 08/07/14 1559    Visit Number 12   Number of Visits 12   Date for PT Re-Evaluation 08/08/14   PT Start Time 2035   PT Stop Time 1600   PT Time Calculation (min) 45 min   Activity Tolerance Patient tolerated treatment well      Past Medical History  Diagnosis Date  . Hypertension   . Diabetes mellitus without complication     No past surgical history on file.  There were no vitals taken for this visit.  Visit Diagnosis:  Pain in joint involving multiple sites  Stiffness of joints, multiple sites  Activity of daily living alteration      Subjective Assessment - 08/07/14 1554    Symptoms always hurts alittle. some aching intermittently. No meds over the past few days.           [facility] PT Assessment - 08/07/14 0001    Observation/Other Assessments   Focus on Therapeutic Outcomes (FOTO)  56%   ROM / Strength   AROM / PROM / Strength Strength   AROM   AROM Assessment Site Finger;Wrist   Right/Left Wrist Left   Left Wrist Extension 50 Degrees   Left Wrist Flexion 53 Degrees   Left Wrist Radial Deviation 10 Degrees   Left Wrist Ulnar Deviation 20 Degrees   Right/Left Finger Left   Left Composite Finger Extension --  2 MCP = 65degrees, 3rd & 4th MCP = 68 degrees, 5th MCP = 72   Strength   Overall Strength --  Wrist Flexion and Extension 5-/5   Strength Assessment Site Hand   Right/Left hand Left   Grip (lbs) 10   Left Hand 3 Point Pinch 5 lbs                  [facility] Adult PT Treatment/Exercise - 08/07/14 0001    Exercises   Exercises Wrist;Hand   Wrist Exercises   Bar Weights/Barbell  (Forearm Supination) 3 lbs  15 times   Bar Weights/Barbell (Forearm Pronation) 3 lbs   Wrist Flexion 15 reps;Strengthening  3 #   Bar Weights/Barbell (Wrist Flexion) 3 lbs   Wrist Extension 15 reps   Bar Weights/Barbell (Wrist Extension) 3 lbs   Wrist Radial Deviation 15 reps   Bar Weights/Barbell (Radial Deviation) 3 lbs   Wrist Ulnar Deviation 15 reps   Bar Weights/Barbell (Ulnar Deviation) 3 lbs   Other wrist exercises --  4 digit - Via light tension   Other wrist exercises --  Via light tension 20 times   Manual Therapy   Manual Therapy Passive ROM   Passive ROM --  stretch into flexion and extension                      PT Long Term Goals - 08/01/14 1148    PT LONG TERM GOAL #1   Title demonstate and / or verbalize techniques to reduce the risk of re-injury to include infor on anit -inflammatory (RICE method). Met 08/01/14   PT LONG TERM GOAL #2   Title be independent with advanced HEP for strength and ROM met 08/01/14   PT LONG TERM  GOAL #3   Title report pain decrease to 1/10 or less with ADLS met 06/21/08   PT LONG TERM GOAL #4   Title demo left hand ROM WFL to don/doff clothing met 08/01/14   PT LONG TERM GOAL #5   Title improve FOTO =/< 37% limited   Additional Long Term Goals   Additional Long Term Goals Yes   PT LONG TERM GOAL #6   Title hold onto to treadmill safely while walking               Plan - 08/07/14 1603    Clinical Impression Statement Discussed with PT for D/C to HEP. Patient is doing well with minimal pain with certain movements. She is not taking OTC meds anymore.   Rehab Potential Excellent   PT Frequency 2x / week   PT Duration 6 weeks   PT Treatment/Interventions Moist Heat;Electrical Stimulation;Cryotherapy;Ultrasound;Therapeutic exercise;Passive range of motion;Manual techniques   PT Next Visit Plan D/C this visit   Consulted and Agree with Plan of Care --  discussed D/C with PT.        Problem List Patient Active  Problem List   Diagnosis Date Noted  . Osteoarthritis of hand, left 07/11/2014  . Osteoarthritis of right knee 05/15/2014  . Right shoulder pain 05/15/2014  . Closed fracture of third, fourth, and fifth metacarpals of left hand 05/15/2014   PHYSICAL THERAPY DISCHARGE SUMMARY  Visits from Start of Care: 12  Current functional level related to goals / functional outcomes: See goals above, FOTO 56% limited, has been at this level for 2 wks.   Remaining deficits: As above, however patient reports she is able to perform all activities she wishes to perform   Education / Equipment: HEP Plan: Patient agrees to discharge.  Patient goals were partially met. Patient is being discharged due to                                                     [facility] rehab potential     [place], PT 08/07/2014, 4:53 PM  [facility] [place], [place], [address] Phone: [phone]   Fax:  [phone]

## 2014-08-07 NOTE — Therapy (Signed)
[place], [place], [address] Phone: [phone]   Fax:  [phone]  Physical Therapy Treatment  Patient Details  Name: Lindsay Young MRN: [id number] Date of Birth: 05/10/1931 Referring Provider:  Silverio Decamp,*  Encounter Date: 08/07/2014      PT End of Session - 08/07/14 1559    Visit Number 12   Number of Visits 12   Date for PT Re-Evaluation 08/08/14   PT Start Time 2035   PT Stop Time 1600   PT Time Calculation (min) 45 min   Activity Tolerance Patient tolerated treatment well      Past Medical History  Diagnosis Date  . Hypertension   . Diabetes mellitus without complication     No past surgical history on file.  There were no vitals taken for this visit.  Visit Diagnosis:  Pain in joint involving multiple sites  Stiffness of joints, multiple sites  Activity of daily living alteration      Subjective Assessment - 08/07/14 1554    Symptoms always hurts alittle. some aching intermittently. No meds over the past few days.           [facility] PT Assessment - 08/07/14 0001    Observation/Other Assessments   Focus on Therapeutic Outcomes (FOTO)  56%   ROM / Strength   AROM / PROM / Strength Strength   AROM   AROM Assessment Site Finger;Wrist   Right/Left Wrist Left   Left Wrist Extension 50 Degrees   Left Wrist Flexion 53 Degrees   Left Wrist Radial Deviation 10 Degrees   Left Wrist Ulnar Deviation 20 Degrees   Right/Left Finger Left   Left Composite Finger Extension --  2 MCP = 65degrees, 3rd & 4th MCP = 68 degrees, 5th MCP = 72   Strength   Overall Strength --  Wrist Flexion and Extension 5-/5   Strength Assessment Site Hand   Right/Left hand Left   Grip (lbs) 10   Left Hand 3 Point Pinch 5 lbs                  [facility] Adult PT Treatment/Exercise - 08/07/14 0001    Exercises   Exercises Wrist;Hand   Wrist Exercises   Bar Weights/Barbell  (Forearm Supination) 3 lbs  15 times   Bar Weights/Barbell (Forearm Pronation) 3 lbs   Wrist Flexion 15 reps;Strengthening  3 #   Bar Weights/Barbell (Wrist Flexion) 3 lbs   Wrist Extension 15 reps   Bar Weights/Barbell (Wrist Extension) 3 lbs   Wrist Radial Deviation 15 reps   Bar Weights/Barbell (Radial Deviation) 3 lbs   Wrist Ulnar Deviation 15 reps   Bar Weights/Barbell (Ulnar Deviation) 3 lbs   Other wrist exercises --  4 digit - Via light tension   Other wrist exercises --  Via light tension 20 times   Manual Therapy   Manual Therapy Passive ROM   Passive ROM --  stretch into flexion and extension                      PT Long Term Goals - 08/01/14 1148    PT LONG TERM GOAL #1   Title demonstate and / or verbalize techniques to reduce the risk of re-injury to include infor on anit -inflammatory (RICE method). Met 08/01/14   PT LONG TERM GOAL #2   Title be independent with advanced HEP for strength and ROM met 08/01/14   PT LONG TERM  GOAL #3   Title report pain decrease to 1/10 or less with ADLS met 02/17/23   PT LONG TERM GOAL #4   Title demo left hand ROM WFL to don/doff clothing met 08/01/14   PT LONG TERM GOAL #5   Title improve FOTO =/< 37% limited   Additional Long Term Goals   Additional Long Term Goals Yes   PT LONG TERM GOAL #6   Title hold onto to treadmill safely while walking               Plan - 08/07/14 1603    Clinical Impression Statement Discussed with PT for D/C to HEP. Patient is doing well with minimal pain with certain movements. She is not taking OTC meds anymore.   Rehab Potential Excellent   PT Frequency 2x / week   PT Duration 6 weeks   PT Treatment/Interventions Moist Heat;Electrical Stimulation;Cryotherapy;Ultrasound;Therapeutic exercise;Passive range of motion;Manual techniques   PT Next Visit Plan D/C this visit   Consulted and Agree with Plan of Care --  discussed D/C with PT.        Problem List Patient Active  Problem List   Diagnosis Date Noted  . Osteoarthritis of hand, left 07/11/2014  . Osteoarthritis of right knee 05/15/2014  . Right shoulder pain 05/15/2014  . Closed fracture of third, fourth, and fifth metacarpals of left hand 05/15/2014    Natividad Brood, PTA   08/07/2014, 4:15 PM  [facility] [place], [place], [address] Phone: [phone]   Fax:  [phone]

## 2014-08-08 ENCOUNTER — Encounter: Payer: Self-pay | Admitting: Sports Medicine

## 2014-08-08 ENCOUNTER — Ambulatory Visit (INDEPENDENT_AMBULATORY_CARE_PROVIDER_SITE_OTHER): Payer: Medicare Other | Admitting: Sports Medicine

## 2014-08-08 VITALS — BP 154/72 | HR 102 | Wt 150.0 lb

## 2014-08-08 DIAGNOSIS — E0590 Thyrotoxicosis, unspecified without thyrotoxic crisis or storm: Secondary | ICD-10-CM

## 2014-08-08 DIAGNOSIS — E785 Hyperlipidemia, unspecified: Secondary | ICD-10-CM | POA: Diagnosis not present

## 2014-08-08 DIAGNOSIS — E119 Type 2 diabetes mellitus without complications: Secondary | ICD-10-CM | POA: Diagnosis not present

## 2014-08-08 DIAGNOSIS — R7989 Other specified abnormal findings of blood chemistry: Secondary | ICD-10-CM

## 2014-08-08 DIAGNOSIS — R946 Abnormal results of thyroid function studies: Secondary | ICD-10-CM

## 2014-08-08 DIAGNOSIS — I10 Essential (primary) hypertension: Secondary | ICD-10-CM

## 2014-08-08 DIAGNOSIS — Z0000 Encounter for general adult medical examination without abnormal findings: Secondary | ICD-10-CM | POA: Diagnosis not present

## 2014-08-08 DIAGNOSIS — M19042 Primary osteoarthritis, left hand: Secondary | ICD-10-CM

## 2014-08-08 NOTE — Assessment & Plan Note (Signed)
Fractures are healing well, pain at the second MCP has resolved after injection.

## 2014-08-08 NOTE — Progress Notes (Signed)
  Subjective:    CC: Follow-up  HPI: Multiple metacarpal fractures: Clinically resolved, range of motion and strength is significantly improving with physical therapy.  Second metacarpal phalangeal osteoarthritis: Resolved after intra-articular injection.  Diabetes mellitus type 2: Stable, minimal discussed this at a future visit, she does desire to establish care here.  Benign essential hypertension: Uncontrolled, desires to establish care, we will get her started on a new blood pressure medication most likely.  Hyperlipidemia: Stable on Zocor, does need a recheck of her lipid panel.  Past medical history, Surgical history, Family history not pertinant except as noted below, Social history, Allergies, and medications have been entered into the medical record, reviewed, and no changes needed.   Review of Systems: No fevers, chills, night sweats, weight loss, chest pain, or shortness of breath.   Objective:    General: Well Developed, well nourished, and in no acute distress.  Neuro: Alert and oriented x3, extra-ocular muscles intact, sensation grossly intact.  HEENT: Normocephalic, atraumatic, pupils equal round reactive to light, neck supple, no masses, no lymphadenopathy, thyroid nonpalpable.  Skin: Warm and dry, no rashes. Cardiac: Regular rate and rhythm, no murmurs rubs or gallops, no lower extremity edema.  Respiratory: Clear to auscultation bilaterally. Not using accessory muscles, speaking in full sentences. Left Hand: Minimal tenderness to palpation over the third, fourth, and fifth metacarpal phalangeal joints, she does have good motion, with good flexion at the proximal and distal interphalangeal joints however limited flexion at the metacarpal phalangeal joints, good strength, continues to work with physical therapy.   Impression and Recommendations:

## 2014-08-08 NOTE — Assessment & Plan Note (Signed)
Continue current medication, checking lipids.

## 2014-08-08 NOTE — Assessment & Plan Note (Signed)
Continue current medications, no changes for now.

## 2014-08-08 NOTE — Assessment & Plan Note (Signed)
Uncontrolled, we will check this in further detail at a future visit.

## 2014-08-10 ENCOUNTER — Encounter: Payer: Medicare Other | Admitting: Physical Therapy

## 2014-08-11 ENCOUNTER — Encounter: Payer: Medicare Other | Admitting: Physical Therapy

## 2014-08-19 LAB — COMPREHENSIVE METABOLIC PANEL WITH GFR
ALT: 10 U/L (ref 0–35)
BUN: 24 mg/dL — ABNORMAL HIGH (ref 6–23)
Chloride: 101 meq/L (ref 96–112)
Glucose, Bld: 99 mg/dL (ref 70–99)
Potassium: 4.6 meq/L (ref 3.5–5.3)
Sodium: 140 meq/L (ref 135–145)
Total Protein: 6.9 g/dL (ref 6.0–8.3)

## 2014-08-19 LAB — CBC
HCT: 38.5 % (ref 36.0–46.0)
Hemoglobin: 12.9 g/dL (ref 12.0–15.0)
MCH: 29.9 pg (ref 26.0–34.0)
MCHC: 33.5 g/dL (ref 30.0–36.0)
MCV: 89.3 fL (ref 78.0–100.0)
MPV: 9.3 fL (ref 8.6–12.4)
Platelets: 289 10*3/uL (ref 150–400)
RBC: 4.31 MIL/uL (ref 3.87–5.11)
RDW: 13.8 % (ref 11.5–15.5)
WBC: 6.1 10*3/uL (ref 4.0–10.5)

## 2014-08-19 LAB — LIPID PANEL
Cholesterol: 163 mg/dL (ref 0–200)
HDL: 47 mg/dL (ref >=46)
LDL Cholesterol: 92 mg/dL (ref 0–99)
Total CHOL/HDL Ratio: 3.5 Ratio
Triglycerides: 118 mg/dL (ref <150)
VLDL: 24 mg/dL (ref 0–40)

## 2014-08-19 LAB — COMPREHENSIVE METABOLIC PANEL
AST: 19 U/L (ref 0–37)
Albumin: 4.1 g/dL (ref 3.5–5.2)
Alkaline Phosphatase: 45 U/L (ref 39–117)
CO2: 28 mEq/L (ref 19–32)
Calcium: 10 mg/dL (ref 8.4–10.5)
Creat: 1.48 mg/dL — ABNORMAL HIGH (ref 0.50–1.10)
Total Bilirubin: 0.7 mg/dL (ref 0.2–1.2)

## 2014-08-19 LAB — VITAMIN D 25 HYDROXY (VIT D DEFICIENCY, FRACTURES): Vit D, 25-Hydroxy: 41 ng/mL (ref 30–100)

## 2014-08-19 LAB — HEMOGLOBIN A1C
Hgb A1c MFr Bld: 6.3 % — ABNORMAL HIGH (ref <5.7)
Mean Plasma Glucose: 134 mg/dL — ABNORMAL HIGH (ref <117)

## 2014-08-19 LAB — TSH: TSH: 0.024 u[IU]/mL — ABNORMAL LOW (ref 0.350–4.500)

## 2014-08-19 LAB — VITAMIN B12: Vitamin B-12: 538 pg/mL (ref 211–911)

## 2014-08-21 DIAGNOSIS — E0590 Thyrotoxicosis, unspecified without thyrotoxic crisis or storm: Secondary | ICD-10-CM | POA: Insufficient documentation

## 2014-08-21 NOTE — Addendum Note (Signed)
Addended by: Silverio Decamp on: 08/21/2014 08:16 AM   Modules accepted: Orders

## 2014-08-21 NOTE — Assessment & Plan Note (Signed)
Checking T3 and T4.

## 2014-08-22 LAB — T4, FREE: Free T4: 1.26 ng/dL (ref 0.80–1.80)

## 2014-08-22 LAB — T3, FREE: T3, Free: 2.9 pg/mL (ref 2.3–4.2)

## 2014-08-23 ENCOUNTER — Encounter: Payer: Self-pay | Admitting: Sports Medicine

## 2014-08-23 ENCOUNTER — Ambulatory Visit (INDEPENDENT_AMBULATORY_CARE_PROVIDER_SITE_OTHER): Payer: Medicare Other | Admitting: Sports Medicine

## 2014-08-23 VITALS — BP 114/53 | HR 87 | Wt 149.0 lb

## 2014-08-23 DIAGNOSIS — E785 Hyperlipidemia, unspecified: Secondary | ICD-10-CM | POA: Diagnosis not present

## 2014-08-23 DIAGNOSIS — Z23 Encounter for immunization: Secondary | ICD-10-CM

## 2014-08-23 DIAGNOSIS — I10 Essential (primary) hypertension: Secondary | ICD-10-CM

## 2014-08-23 DIAGNOSIS — E119 Type 2 diabetes mellitus without complications: Secondary | ICD-10-CM

## 2014-08-23 DIAGNOSIS — Z0000 Encounter for general adult medical examination without abnormal findings: Secondary | ICD-10-CM | POA: Diagnosis not present

## 2014-08-23 MED ORDER — METFORMIN HCL 1000 MG PO TABS: 1000.0000 mg | ORAL_TABLET | Freq: Two times a day (BID) | ORAL | Status: DC

## 2014-08-23 MED ORDER — SIMVASTATIN 10 MG PO TABS: 10.0000 mg | ORAL_TABLET | Freq: Every day | ORAL | Status: DC

## 2014-08-23 MED ORDER — GLIMEPIRIDE 4 MG PO TABS: 4.0000 mg | ORAL_TABLET | Freq: Two times a day (BID) | ORAL | Status: DC

## 2014-08-23 NOTE — Assessment & Plan Note (Signed)
Currently taking insulin, she does have a few lows. She has 4 weeks left of insulin, I would like to discontinue this, and we will restart glimepiride and metformin, creatinine was 1.48, we will simply keep an eye on this.

## 2014-08-23 NOTE — Assessment & Plan Note (Signed)
Well controlled, no changes 

## 2014-08-23 NOTE — Assessment & Plan Note (Signed)
Up-to-date on most of her screening measures, tetanus and pneumococcal 13 given today. Ordering DEXA scan. Return in 4 months to recheck A1c, and likely for Medicare physical.

## 2014-08-23 NOTE — Progress Notes (Signed)
  Subjective:    CC: Follow-up  HPI: Diabetes mellitus type 2: Currently on insulin, recent A1c was 6.3, would like to come off of insulin if able.  Hypertension: Well controlled  Hyperlipidemia: Well controlled  Subclinical hyperthyroidism: Low TSH but T3 and T4 were normal. Asymptomatic.  Past medical history, Surgical history, Family history not pertinant except as noted below, Social history, Allergies, and medications have been entered into the medical record, reviewed, and no changes needed.   Review of Systems: No fevers, chills, night sweats, weight loss, chest pain, or shortness of breath.   Objective:    General: Well Developed, well nourished, and in no acute distress.  Neuro: Alert and oriented x3, extra-ocular muscles intact, sensation grossly intact.  HEENT: Normocephalic, atraumatic, pupils equal round reactive to light, neck supple, no masses, no lymphadenopathy, thyroid nonpalpable.  Skin: Warm and dry, no rashes. Cardiac: Regular rate and rhythm, no murmurs rubs or gallops, no lower extremity edema.  Respiratory: Clear to auscultation bilaterally. Not using accessory muscles, speaking in full sentences.  Diabetic Foot Exam Both feet were examined, there are no signs of ulceration or abnormal callus. Nails are unremarkable. Dorsalis pedis and posterior tibial pulses are palpable. Sensation is intact to sharp and monofilament. Shoes are of appropriate fitment.  Impression and Recommendations:

## 2014-08-23 NOTE — Assessment & Plan Note (Signed)
Extremely well controlled, refilling simvastatin.

## 2014-08-23 NOTE — Progress Notes (Deleted)

## 2014-08-29 ENCOUNTER — Other Ambulatory Visit: Payer: Self-pay | Admitting: *Deleted

## 2014-08-29 MED ORDER — BENAZEPRIL HCL 40 MG PO TABS: 40.0000 mg | ORAL_TABLET | Freq: Every day | ORAL | Status: DC

## 2014-08-30 ENCOUNTER — Other Ambulatory Visit: Payer: Medicare Other

## 2014-09-06 ENCOUNTER — Ambulatory Visit (INDEPENDENT_AMBULATORY_CARE_PROVIDER_SITE_OTHER): Payer: Medicare Other

## 2014-09-06 DIAGNOSIS — M859 Disorder of bone density and structure, unspecified: Secondary | ICD-10-CM

## 2014-12-08 ENCOUNTER — Telehealth: Payer: Self-pay

## 2014-12-08 DIAGNOSIS — E119 Type 2 diabetes mellitus without complications: Secondary | ICD-10-CM

## 2014-12-08 MED ORDER — GLIMEPIRIDE 4 MG PO TABS: 4.0000 mg | ORAL_TABLET | Freq: Two times a day (BID) | ORAL | Status: DC

## 2014-12-08 NOTE — Telephone Encounter (Signed)
Patient request refill for Glimepiride 4 mg, #180 3 Refills were sent to [facility]. Rhonda Cunningham,CMA

## 2014-12-25 ENCOUNTER — Ambulatory Visit (INDEPENDENT_AMBULATORY_CARE_PROVIDER_SITE_OTHER): Payer: Medicare Other | Admitting: Sports Medicine

## 2014-12-25 ENCOUNTER — Encounter: Payer: Self-pay | Admitting: Sports Medicine

## 2014-12-25 VITALS — BP 127/67 | HR 106 | Ht 68.0 in | Wt 138.0 lb

## 2014-12-25 DIAGNOSIS — M25532 Pain in left wrist: Secondary | ICD-10-CM

## 2014-12-25 DIAGNOSIS — E119 Type 2 diabetes mellitus without complications: Secondary | ICD-10-CM

## 2014-12-25 DIAGNOSIS — E785 Hyperlipidemia, unspecified: Secondary | ICD-10-CM | POA: Diagnosis not present

## 2014-12-25 DIAGNOSIS — Z0000 Encounter for general adult medical examination without abnormal findings: Secondary | ICD-10-CM

## 2014-12-25 DIAGNOSIS — I10 Essential (primary) hypertension: Secondary | ICD-10-CM | POA: Diagnosis not present

## 2014-12-25 LAB — POCT GLYCOSYLATED HEMOGLOBIN (HGB A1C): Hemoglobin A1C: 6.4

## 2014-12-25 NOTE — Assessment & Plan Note (Signed)
At this point we have discontinued her insulin, and we have cut her metformin and glyburide down to once daily, hemoglobin A1c was 6.4. No changes. No further hypoglycemia. Foot exam done today.

## 2014-12-25 NOTE — Progress Notes (Signed)
Subjective:    Lindsay Young is a 81 y.o. female who presents for Medicare Annual/Subsequent preventive examination.  Preventive Screening-Counseling & Management  Tobacco History  Smoking status  . Never Smoker   Smokeless tobacco  . Never Used     Problems Prior to Visit 1. Diabetes mellitus type 2: Off of all insulin now, we have decreased metformin and Amaryl once daily in the morning.  2. Hypertension: Controlled  3. Hyperlipidemia: Stable and controlled  Current Problems (verified) Patient Active Problem List   Diagnosis Date Noted  . Subclinical hyperthyroidism 08/21/2014  . Diabetes mellitus type 2 in nonobese 08/08/2014  . Essential hypertension, benign 08/08/2014  . Hyperlipidemia 08/08/2014  . Annual physical exam 08/08/2014  . Osteoarthritis of hand, left 07/11/2014  . Osteoarthritis of right knee 05/15/2014  . Right shoulder pain 05/15/2014  . Closed fracture of third, fourth, and fifth metacarpals of left hand 05/15/2014    Medications Prior to Visit Current Outpatient Prescriptions on File Prior to Visit  Medication Sig Dispense Refill  . amLODipine-benazepril (LOTREL) 5-40 MG per capsule Take by mouth daily. 1/2 daily    . aspirin 81 MG tablet Take 81 mg by mouth daily.    . benazepril (LOTENSIN) 40 MG tablet Take 1 tablet (40 mg total) by mouth daily. 90 tablet 1  . glimepiride (AMARYL) 4 MG tablet Take 1 tablet (4 mg total) by mouth 2 (two) times daily. 180 tablet 3  . metFORMIN (GLUCOPHAGE) 1000 MG tablet Take 1 tablet (1,000 mg total) by mouth 2 (two) times daily with a meal. 180 tablet 3  . oxyCODONE-acetaminophen (PERCOCET) 10-325 MG per tablet Take 1 tablet by mouth every 8 (eight) hours as needed for pain. (Patient taking differently: Take 1 tablet by mouth daily. ) 30 tablet 0  . simvastatin (ZOCOR) 10 MG tablet Take 1 tablet (10 mg total) by mouth daily. 90 tablet 3   No current facility-administered medications on file prior to visit.     Current Medications (verified) Current Outpatient Prescriptions  Medication Sig Dispense Refill  . amLODipine-benazepril (LOTREL) 5-40 MG per capsule Take by mouth daily. 1/2 daily    . aspirin 81 MG tablet Take 81 mg by mouth daily.    . benazepril (LOTENSIN) 40 MG tablet Take 1 tablet (40 mg total) by mouth daily. 90 tablet 1  . glimepiride (AMARYL) 4 MG tablet Take 1 tablet (4 mg total) by mouth 2 (two) times daily. 180 tablet 3  . metFORMIN (GLUCOPHAGE) 1000 MG tablet Take 1 tablet (1,000 mg total) by mouth 2 (two) times daily with a meal. 180 tablet 3  . oxyCODONE-acetaminophen (PERCOCET) 10-325 MG per tablet Take 1 tablet by mouth every 8 (eight) hours as needed for pain. (Patient taking differently: Take 1 tablet by mouth daily. ) 30 tablet 0  . simvastatin (ZOCOR) 10 MG tablet Take 1 tablet (10 mg total) by mouth daily. 90 tablet 3   No current facility-administered medications for this visit.     Allergies (verified) Hydrocodone; Keflex; and Sulfanilamide   PAST HISTORY  Family History History reviewed. No pertinent family history.  Social History History  Substance Use Topics  . Smoking status: Never Smoker   . Smokeless tobacco: Never Used  . Alcohol Use: No     Are there smokers in your home (other than you)? No  Risk Factors Current exercise habits: The patient does not participate in regular exercise at present.  Dietary issues discussed: No   Cardiac risk factors: advanced  age (older than 42 for men, 63 for women), diabetes mellitus, hypertension and sedentary lifestyle.  Depression Screen (Note: if answer to either of the following is "Yes", a more complete depression screening is indicated)   Over the past two weeks, have you felt down, depressed or hopeless? No  Over the past two weeks, have you felt little interest or pleasure in doing things? No  Have you lost interest or pleasure in daily life? No  Do you often feel hopeless? No  Do you cry  easily over simple problems? No  Activities of Daily Living In your present state of health, do you have any difficulty performing the following activities?:  Driving? No Managing money?  No Feeding yourself? No Getting from bed to chair? NoNo exam performed today, no indicated. Climbing a flight of stairs? No Preparing food and eating?: No Bathing or showering? No Getting dressed: No Getting to the toilet? No Using the toilet:No Moving around from place to place: No In the past year have you fallen or had a near fall?:No   Are you sexually active?  No  Do you have more than one partner?  No  Hearing Difficulties: No Do you often ask people to speak up or repeat themselves? No Do you experience ringing or noises in your ears? No Do you have difficulty understanding soft or whispered voices? No   Do you feel that you have a problem with memory? No  Do you often misplace items? No  Do you feel safe at home?  Yes  Cognitive Testing  Alert? Yes  Normal Appearance?Yes  Oriented to person? Yes  Place? Yes   Time? Yes  Recall of three objects?  Yes  Can perform simple calculations? Yes  Displays appropriate judgment?Yes  Can read the correct time from a watch face?Yes   Advanced Directives have been discussed with the patient? Yes  List the Names of Other Physician/Practitioners you currently use: 1.    Indicate any recent Medical Services you may have received from other than [facility] providers in the past year (date may be approximate).  Immunization History  Administered Date(s) Administered  . Pneumococcal Conjugate-13 08/23/2014  . Tdap 08/23/2014    Screening Tests Health Maintenance  Topic Date Due  . OPHTHALMOLOGY EXAM  09/15/2014  . INFLUENZA VACCINE  01/15/2015  . HEMOGLOBIN A1C  02/18/2015  . FOOT EXAM  08/23/2015  . PNA vac Low Risk Adult (2 of 2 - PPSV23) 08/23/2015  . COLONOSCOPY  06/17/2023  . TETANUS/TDAP  08/22/2024  . DEXA SCAN  Completed  .  ZOSTAVAX  Addressed    All answers were reviewed with the patient and necessary referrals were made:  Aundria Mems, MD   12/25/2014   History reviewed: allergies, current medications, past family history, past medical history, past social history, past surgical history and problem list  Review of Systems A comprehensive review of systems was negative.    Objective:     Body mass index is 20.99 kg/(m^2). BP 127/67 mmHg  Pulse 106  Ht 5\' 8"  (1.727 m)  Wt 138 lb (62.596 kg)  BMI 20.99 kg/m2 General: Well Developed, well nourished, and in no acute distress.  Neuro: Alert and oriented x3, extra-ocular muscles intact, sensation grossly intact. Cranial nerves II through XII are intact, motor, sensory, and coordinative functions are all intact. HEENT: Normocephalic, atraumatic, pupils equal round reactive to light, neck supple, no masses, no lymphadenopathy, thyroid nonpalpable. Oropharynx, nasopharynx, external ear canals are unremarkable. Skin: Warm and  dry, no rashes noted.  Cardiac: Regular rate and rhythm, no murmurs rubs or gallops.  Respiratory: Clear to auscultation bilaterally. Not using accessory muscles, speaking in full sentences.  Abdominal: Soft, nontender, nondistended, positive bowel sounds, no masses, no organomegaly.  Left Wrist: Inspection normal with no visible erythema or swelling. ROM smooth and normal with good flexion and extension and ulnar/radial deviation that is symmetrical with opposite wrist. Tender to palpation over the radiocarpal joint dorsally No snuffbox tenderness. No tenderness over Canal of [place]. Strength 5/5 in all directions without pain. Negative Finkelstein, tinel's and phalens. Negative Watson's test.      Assessment:     1. Healthy female     Plan:     During the course of the visit the patient was educated and counseled about appropriate screening and preventive services including:    Diabetes preventive measures  Diet  review for nutrition referral? Yes ____  Not Indicated __x__   Patient Instructions (the written plan) was given to the patient.  Medicare Attestation I have personally reviewed: The patient's medical and social history Their use of alcohol, tobacco or illicit drugs Their current medications and supplements The patient's functional ability including ADLs,fall risks, home safety risks, cognitive, and hearing and visual impairment Diet and physical activities Evidence for depression or mood disorders  The patient's weight, height, BMI, and visual acuity have been recorded in the chart.  I have made referrals, counseling, and provided education to the patient based on review of the above and I have provided the patient with a written personalized care plan for preventive services.     Aundria Mems, MD   12/25/2014

## 2014-12-25 NOTE — Assessment & Plan Note (Signed)
There is also an element of numbness and tingling predominantly nocturnal. This likely represents tunnel syndrome. Nighttime splint, rehabilitation exercises, return as needed.

## 2014-12-25 NOTE — Assessment & Plan Note (Signed)
Medicare physical today. Up-to-date on measures, she does have an ophthalmologist visit coming up next month.

## 2014-12-25 NOTE — Assessment & Plan Note (Signed)
Stable and controlled, no changes

## 2014-12-25 NOTE — Assessment & Plan Note (Signed)
Controlled, no changes. 

## 2015-02-15 ENCOUNTER — Other Ambulatory Visit: Payer: Self-pay | Admitting: Sports Medicine

## 2015-03-09 ENCOUNTER — Encounter: Payer: Self-pay | Admitting: Sports Medicine

## 2015-03-09 ENCOUNTER — Ambulatory Visit (INDEPENDENT_AMBULATORY_CARE_PROVIDER_SITE_OTHER): Payer: Medicare Other | Admitting: Sports Medicine

## 2015-03-09 VITALS — BP 122/41 | HR 84 | Ht 67.0 in | Wt 133.0 lb

## 2015-03-09 DIAGNOSIS — M720 Palmar fascial fibromatosis [Dupuytren]: Secondary | ICD-10-CM | POA: Diagnosis not present

## 2015-03-09 DIAGNOSIS — Z23 Encounter for immunization: Secondary | ICD-10-CM

## 2015-03-09 DIAGNOSIS — M1711 Unilateral primary osteoarthritis, right knee: Secondary | ICD-10-CM

## 2015-03-09 MED ORDER — TRAMADOL HCL 50 MG PO TABS: ORAL_TABLET | ORAL | Status: DC

## 2015-03-09 NOTE — Assessment & Plan Note (Signed)
Mild and patient is able to put both palms flat on the table.

## 2015-03-09 NOTE — Assessment & Plan Note (Signed)
With an acute injury, likely a degenerative meniscal tear after twisting her right knee. She is very tender along the posterior medial joint line. Injection, knee brace, return in one month. Tramadol for pain

## 2015-03-09 NOTE — Progress Notes (Signed)
  Subjective:    CC: Right Knee pain  HPI: This is a pleasant 81 year old female, she has a history of right knee osteoarthritis, several days ago she twisted her knee, she had immediate pain that she localizes at the posterior medial joint line, no mechanical symptoms, pain is worst with terminal flexion.  Pain is severe, she has great difficulty bearing weight.  Past medical history, Surgical history, Family history not pertinant except as noted below, Social history, Allergies, and medications have been entered into the medical record, reviewed, and no changes needed.   Review of Systems: No fevers, chills, night sweats, weight loss, chest pain, or shortness of breath.   Objective:    General: Well Developed, well nourished, and in no acute distress.  Neuro: Alert and oriented x3, extra-ocular muscles intact, sensation grossly intact.  HEENT: Normocephalic, atraumatic, pupils equal round reactive to light, neck supple, no masses, no lymphadenopathy, thyroid nonpalpable.  Skin: Warm and dry, no rashes. Cardiac: Regular rate and rhythm, no murmurs rubs or gallops, no lower extremity edema.  Respiratory: Clear to auscultation bilaterally. Not using accessory muscles, speaking in full sentences. Right Knee: Normal to inspection with no erythema or effusion or obvious bony abnormalities. Tender to palpation at the medial joint line ROM normal in flexion and extension and lower leg rotation. Ligaments with solid consistent endpoints including ACL, PCL, LCL, MCL. Negative Mcmurray's and provocative meniscal tests. Non painful patellar compression. Patellar and quadriceps tendons unremarkable. Hamstring and quadriceps strength is normal.  Procedure: Real-time Ultrasound Guided Injection of right knee Device: GE Logiq E  Verbal informed consent obtained.  Time-out conducted.  Noted no overlying erythema, induration, or other signs of local infection.  Skin prepped in a sterile fashion.    Local anesthesia: Topical Ethyl chloride.  With sterile technique and under real time ultrasound guidance:  2 mL Kenalog 40, 4 mL lidocaine injected easily into the suprapatellar recess. Completed without difficulty  Pain immediately resolved suggesting accurate placement of the medication.  Advised to call if fevers/chills, erythema, induration, drainage, or persistent bleeding.  Images permanently stored and available for review in the ultrasound unit.  Impression: Technically successful ultrasound guided injection.  Impression and Recommendations:

## 2015-03-09 NOTE — Patient Instructions (Signed)
Dupuytren's Contracture Dupuytren's contracture affects the fingers and the palm of the hand. This condition usually develops slowly. It may take many years to develop. The pinky finger and the ring finger are most often affected. These fingers start to curve inward, like a claw. At some point, the fingers cannot go straight anymore. This can make it hard to do things like:  Put on gloves.  Shake hands.  Grab something off a shelf. The condition usually does not cause pain and is not dangerous. The condition gets its name from the doctor who came up with an operation to fix the problem. His name was Baron Guillaume Dupuytren. Contracture means pulling inward. CAUSES  Dupuytren's contracture does not start with the fingers. It starts in the palm of the hand, under the skin. The tissue under the skin is called fascia. The fascia covers the cords (tendons) that control how the fingers move. In Dupuytren's contracture the fascia tissue becomes thick and then pulls on the cords. That causes the fingers to curl. The condition can affect both hands and any fingers, but it usually strikes one hand worse than the other. The fingers farthest from the thumb are most often the ones that curl. The cause is not clear. Some experts believe it results from an autoimmune reaction. That means the body's immune system (which fights off disease) attacks itself by mistake. What experts do know is that certain conditions and behaviors (called risk factors) make the chance of having this condition more likely. They include:  Age. Most people who have the condition are older than 50.  Sex. It affects men more often than women.  Family history. The condition tends to run in families from countries in [place].  Certain behaviors. People who smoke and drink alcohol are more apt to develop the problem.  Some other medical conditions. Having diabetes makes Dupuytren's contracture more likely. So does  having a condition that involves a seizure (when the brain's function is interrupted). SYMPTOMS  Signs of this condition take time to develop. Sometimes this takes weeks or months. More often, it takes several years.   Early symptoms:  Skin on the palm of the hand becomes thick. This is usually the first sign.  The skin may look dimpled or puckered.  Lumps (nodules) show up on the palm. There may be one or more lumps. They are not painful.  Later symptoms:  Thick cords of tissue form in the palm of the hand.  The pinky and ring fingers start to curl up into the palm.  The fingers cannot be straightened into their normal position. DIAGNOSIS  A physical examination is the main way that a healthcare provider can tell if you have Dupuytren's contracture. Other tests usually are not needed. The caregiver will probably:  Look at your hands. Feel your hands. This is to check for thickening and nodules.  Measure finger motion. This tells how much your fingers have contracted (pulled in).  Do a tabletop test. You will be asked to try to put your hand flat on a table, palm down. TREATMENT  There is no cure for Dupuytren's contracture. But there are ways to treat the symptoms. Options include:  Watching and waiting. The condition develops slowly. Often it does not create problems for a long time. Sometimes the skin gets thick and nodules form, but the fingers never curl. So, in some cases it is best to just watch the condition carefully and wait to see what happens.    Shots (injections). Different substances may be injected, including:  Steroids. These drugs block swelling. These shots should make the condition less uncomfortable. Steroids may also slow down the condition. Shots are given into the nodules. The effect only lasts awhile. More shots may have to be given.  Enzymes. These are proteins. They weaken the thick tissue. After an injection, the caregiver usually stretches the  fingers.  Needling. A needle is pushed through the skin and into the thick tissue. This is done in several spots. The goal is to break up the thickened tissue. Or to weaken it.  Surgery. This may be suggested if you cannot grasp objects. Or, if you can no longer put your hand in your pocket.  A cut (incision) is made in the palm of the hand. The thick tissue is removed.  Sometimes the thick tissue is attached to the skin. Then, the skin must be removed, too. It is replaced with a piece of skin from another place on your body. That is called a skin graft.  Occupational or hand therapy is almost always needed after surgery. This involves special exercises to get back the use of your hand and fingers. After a skin graft, several months of therapy may be needed.  Sometimes the condition comes back, even after surgery.  Other methods. You can do some things on your own. They include:  Stretching the fingers backwards. Do this often.  Warming the hand and massaging it. Again, do this often.  Using tools with padded grips. This should make things easier.  Wearing heavy gloves while working. This protects the hands. PROGNOSIS  Dupuytren's contracture usually develops slowly. There is no cure. But, the symptoms can be treated. Sometimes they come back after treatment, but not always. It is important to remember that this is a functional problem and not a life-threatening condition. Document Released: 03/30/2009 Document Revised: 08/25/2011 Document Reviewed: 03/30/2009 Exit[employer] Patient Information 2015 [employer]. This information is not intended to replace advice given to you by your health care provider. Make sure you discuss any questions you have with your health care provider.  

## 2015-04-09 ENCOUNTER — Ambulatory Visit (INDEPENDENT_AMBULATORY_CARE_PROVIDER_SITE_OTHER): Payer: Medicare Other | Admitting: Sports Medicine

## 2015-04-09 ENCOUNTER — Encounter: Payer: Self-pay | Admitting: Sports Medicine

## 2015-04-09 DIAGNOSIS — E119 Type 2 diabetes mellitus without complications: Secondary | ICD-10-CM

## 2015-04-09 DIAGNOSIS — E785 Hyperlipidemia, unspecified: Secondary | ICD-10-CM | POA: Diagnosis not present

## 2015-04-09 DIAGNOSIS — M1711 Unilateral primary osteoarthritis, right knee: Secondary | ICD-10-CM

## 2015-04-09 DIAGNOSIS — E0590 Thyrotoxicosis, unspecified without thyrotoxic crisis or storm: Secondary | ICD-10-CM

## 2015-04-09 NOTE — Progress Notes (Signed)
  Subjective:    CC: follow-up  HPI: Lindsay Young returns, she is a pleasant 81 year old female, I injected her right knee at the last visit and she returns with pain completely resolved, she is agreeable to do aggressive formal physical therapy to prevent recurrence.  Fatigue: Very nonspecific, she does have a history subclinical hyperthyroidism, history of depression in the distant past but she denies any current depressive symptoms. She tends to spend a great real-time sitting on the couch and her husband who accompanies her today thinks that we need to increase her physical activity level, she has lost approximately 30 pounds over the past several months, but we did discontinue her insulin at a previous visit, insulin is weight positive.her previous hemoglobin A1c was wonderfully controlled off of insulin, and switching her simply to oral hypoglycemic agents.  Past medical history, Surgical history, Family history not pertinant except as noted below, Social history, Allergies, and medications have been entered into the medical record, reviewed, and no changes needed.   Review of Systems: No fevers, chills, night sweats, weight loss, chest pain, or shortness of breath.   Objective:    General: Well Developed, well nourished, and in no acute distress.  Neuro: Alert and oriented x3, extra-ocular muscles intact, sensation grossly intact.  HEENT: Normocephalic, atraumatic, pupils equal round reactive to light, neck supple, no masses, no lymphadenopathy, thyroid nonpalpable.  Skin: Warm and dry, no rashes. Cardiac: Regular rate and rhythm, no murmurs rubs or gallops, no lower extremity edema.  Respiratory: Clear to auscultation bilaterally. Not using accessory muscles, speaking in full sentences. Right Knee: Normal to inspection with no erythema or effusion or obvious bony abnormalities. Palpation normal with no warmth or joint line tenderness or patellar tenderness or condyle tenderness. ROM normal  in flexion and extension and lower leg rotation. Ligaments with solid consistent endpoints including ACL, PCL, LCL, MCL. Negative Mcmurray's and provocative meniscal tests. Non painful patellar compression. Patellar and quadriceps tendons unremarkable. Hamstring and quadriceps strength is normal.  Impression and Recommendations:

## 2015-04-09 NOTE — Assessment & Plan Note (Addendum)
Completely resolved after injection at the last visit.  Adding aggressive formal PT

## 2015-04-09 NOTE — Assessment & Plan Note (Signed)
Doing well on oral medications. Rechecking A1c. She will hold her Amaryl prior to her fasting blood work tomorrow morning

## 2015-04-09 NOTE — Assessment & Plan Note (Signed)
With significant fatigue. Rechecking TSH, T3, T4. No symptoms of depression.

## 2015-04-09 NOTE — Assessment & Plan Note (Signed)
Rechecking lipids. 

## 2015-04-10 LAB — CBC
HCT: 33.6 % — ABNORMAL LOW (ref 36.0–46.0)
Hemoglobin: 11.7 g/dL — ABNORMAL LOW (ref 12.0–15.0)
MCH: 29.1 pg (ref 26.0–34.0)
MCHC: 34.8 g/dL (ref 30.0–36.0)
MCV: 83.6 fL (ref 78.0–100.0)
MPV: 8.6 fL (ref 8.6–12.4)
Platelets: 353 10*3/uL (ref 150–400)
RBC: 4.02 MIL/uL (ref 3.87–5.11)
RDW: 13.3 % (ref 11.5–15.5)
WBC: 7.1 K/uL (ref 4.0–10.5)

## 2015-04-10 LAB — LIPID PANEL
Cholesterol: 142 mg/dL (ref 125–200)
HDL: 41 mg/dL — ABNORMAL LOW (ref >=46)
LDL Cholesterol: 72 mg/dL (ref <130)
Total CHOL/HDL Ratio: 3.5 ratio (ref <=5.0)
Triglycerides: 145 mg/dL (ref <150)
VLDL: 29 mg/dL (ref <30)

## 2015-04-10 LAB — COMPREHENSIVE METABOLIC PANEL
ALT: 8 U/L (ref 6–29)
Alkaline Phosphatase: 62 U/L (ref 33–130)
BUN: 20 mg/dL (ref 7–25)
CO2: 23 mmol/L (ref 20–31)
Creat: 1.3 mg/dL — ABNORMAL HIGH (ref 0.60–0.88)
Potassium: 4.8 mmol/L (ref 3.5–5.3)
Sodium: 136 mmol/L (ref 135–146)
Total Bilirubin: 0.6 mg/dL (ref 0.2–1.2)
Total Protein: 6.7 g/dL (ref 6.1–8.1)

## 2015-04-10 LAB — COMPREHENSIVE METABOLIC PANEL WITH GFR
AST: 19 U/L (ref 10–35)
Albumin: 3.8 g/dL (ref 3.6–5.1)
Calcium: 10.3 mg/dL (ref 8.6–10.4)
Chloride: 99 mmol/L (ref 98–110)
Glucose, Bld: 82 mg/dL (ref 65–99)

## 2015-04-10 LAB — TSH: TSH: 0.016 u[IU]/mL — ABNORMAL LOW (ref 0.350–4.500)

## 2015-04-10 LAB — HEMOGLOBIN A1C
Hgb A1c MFr Bld: 6.2 % — ABNORMAL HIGH (ref <5.7)
Mean Plasma Glucose: 131 mg/dL — ABNORMAL HIGH (ref <117)

## 2015-04-10 LAB — T3, FREE: T3, Free: 2.8 pg/mL (ref 2.3–4.2)

## 2015-04-10 LAB — T4, FREE: Free T4: 1.22 ng/dL (ref 0.80–1.80)

## 2015-04-12 ENCOUNTER — Ambulatory Visit (INDEPENDENT_AMBULATORY_CARE_PROVIDER_SITE_OTHER): Payer: Medicare Other | Admitting: Physical Therapy

## 2015-04-12 ENCOUNTER — Ambulatory Visit: Payer: Medicare Other | Admitting: Physical Therapy

## 2015-04-12 ENCOUNTER — Encounter: Payer: Self-pay | Admitting: Physical Therapy

## 2015-04-12 DIAGNOSIS — R269 Unspecified abnormalities of gait and mobility: Secondary | ICD-10-CM

## 2015-04-12 DIAGNOSIS — M25661 Stiffness of right knee, not elsewhere classified: Secondary | ICD-10-CM | POA: Diagnosis not present

## 2015-04-12 DIAGNOSIS — R531 Weakness: Secondary | ICD-10-CM | POA: Diagnosis not present

## 2015-04-12 NOTE — Patient Instructions (Signed)
Strengthening: Straight Leg Raise (Phase 1)    [place] [phone]    Tighten muscles on front of right thigh, then lift leg _10-12___ inches from surface, keeping knee locked.  Repeat _20___ times per set. Do __1__ sets per session. Do __1__ sessions per day.   Strengthening: Hip Extension (Prone)    Tighten muscles on front of right thigh, then lift leg _8-10___ inches from surface, keeping knee locked. Repeat __20__ times per set. Do _1___ sets per session. Do __1__ sessions per day.  Strengthening: Hip Abduction (Side-Lying)    Tighten muscles on front of right thigh, then lift leg _10-12___ inches from surface, keeping knee locked.  Repeat _20___ times per set. Do _1___ sets per session. Do _1___ sessions per day.  Quads / HF, Yoga Half Bow    Lie face down, use a strap/sheet or belt around foot. Pull heel towards the bottom to feel a stretch on the front of your thigh. Hold _30__ seconds. Repeat on the other leg.  Repeat _2__ times per session. Do _1__ sessions per day.  Strengthening: Quadriceps Set        You can lie down for this    Tighten muscles on top of thighs by pushing knees down into surface. Hold __5__ seconds. Repeat _10___ times per set. Do __1__ sets per session. Do __1__ sessions per day.

## 2015-04-12 NOTE — Therapy (Signed)
[facility], [place], [address] Phone: [phone]   Fax:  [phone]  Physical Therapy Evaluation  Patient Details  Name: Lindsay Young MRN: [id number] Date of Birth: 06/25/1930 Referring Provider: Dr Dianah Field  Encounter Date: 04/12/2015      PT End of Session - 04/12/15 1010    Visit Number 1   Number of Visits 8   Date for PT Re-Evaluation 05/10/15   PT Start Time 0934   PT Stop Time 1011   PT Time Calculation (min) 37 min   Activity Tolerance Patient tolerated treatment well      Past Medical History  Diagnosis Date  . Hypertension   . Diabetes mellitus without complication ([place])     History reviewed. No pertinent past surgical history.  There were no vitals filed for this visit.  Visit Diagnosis:  Weakness generalized - Plan: PT plan of care cert/re-cert  Abnormality of gait - Plan: PT plan of care cert/re-cert  Stiffness of knee joint, right - Plan: PT plan of care cert/re-cert      Subjective Assessment - 04/12/15 0938    Subjective Pt reports that about a month ago while getting into the car developed pain in the Rt knee, had an injection the day after and this  helped.  WAs using a walker, stopped last week however still limps around.    Pertinent History Pt reports that her Kt knee feels like it gives out.    How long can you walk comfortably? doing some community ambulation.    Patient Stated Goals walk up 3 steps in the garage., walk on her treadmill, was walking a mile.    Currently in Pain? No/denies            [facility] PT Assessment - 04/12/15 0001    Assessment   Medical Diagnosis Rt knee OA   Referring Provider Dr Dianah Field   Onset Date/Surgical Date 03/13/15   Next MD Visit 05/08/15   Prior Therapy not for this   Precautions   Precautions None   Required Braces or Orthoses --  wearing knee compression brace during the day.    Balance Screen   Has  the patient fallen in the past 6 months No   Has the patient had a decrease in activity level because of a fear of falling?  No   Is the patient reluctant to leave their home because of a fear of falling?  No   Home Environment   Living Environment Private residence   Home Access Stairs to enter  3 steps from the garage   Prior Function   Level of Independence Independent   Vocation Retired   Leisure read   Observation/Other Assessments   Focus on Therapeutic Outcomes (FOTO)  59% limited   Functional Tests   Functional tests Single leg stance   Single Leg Stance   Comments Rt 6 sec, Lt 12 sec   Posture/Postural Control   Posture/Postural Control Postural limitations   Postural Limitations Rounded Shoulders;Flexed trunk;Forward head   ROM / Strength   AROM / PROM / Strength AROM;Strength   AROM   AROM Assessment Site Knee   Right/Left Knee Left;Right   Right Knee Extension -4   Right Knee Flexion 131   Left Knee Extension 0   Left Knee Flexion 132   Strength   Strength Assessment Site Knee;Hip   Right/Left Hip Right  Lt hip WNL   Right Hip Flexion 4-/5  Right Hip Extension 4/5   Right Hip ABduction --  5-/5   Right/Left Knee Right  Lt knee WNL   Right Knee Flexion 4+/5   Right Knee Extension 4-/5   Flexibility   Soft Tissue Assessment /Muscle Length yes   Quadriceps Rt very tight in prone, only able to bend to 85 degrees   Palpation   Patella mobility pain with patellar mobility in distal aspect   Ambulation/Gait   Gait Comments antalgic . decreased stance Rt , forward flexed posturing.  She appears unstable however reports no falls                   [facility] Adult PT Treatment/Exercise - 04/12/15 0001    Self-Care   Self-Care --  discussed Dealer around knee flex/ext and affects quad tig   Exercises   Exercises Knee/Hip   Knee/Hip Exercises: Stretches   Quad Stretch 2 reps;Both;30 seconds  prone with strap   Knee/Hip Exercises: Supine   Straight  Leg Raises Strengthening;Right;20 reps   Knee/Hip Exercises: Sidelying   Hip ABduction Strengthening;Right;20 reps   Knee/Hip Exercises: Prone   Straight Leg Raises Strengthening;Right;20 reps                PT Education - 04/12/15 1004    Education provided Yes   Education Details HEP    Person(s) Educated Patient   Methods Explanation;Handout   Comprehension Returned demonstration;Verbalized understanding;Verbal cues required             PT Long Term Goals - 04/12/15 1018    PT LONG TERM GOAL #1   Title I with HEP ( 05/10/15)    Time 4   Period Weeks   Status New   PT LONG TERM GOAL #2   Title demo full Rt knee extension without pain ( 05/10/15)    Time 4   Period Weeks   Status New   PT LONG TERM GOAL #3   Title demo Rt knee strength =/> 5-/5 (05/10/15)    Time 4   Period Weeks   Status New   PT LONG TERM GOAL #4   Title demo Rt hip strength =/> 5-/5 ( 05/10/15)    Time 4   Period Weeks   Status New   PT LONG TERM GOAL #5   Title report =/> 50% improvement in feelings of instability with walking (05/10/15)    Time 4   Period Weeks   Status New   PT LONG TERM GOAL #6   Title improve FOTO =/< 43% limited CK level (05/10/15)    Time 4   Period Weeks   Status New               Plan - 04/12/15 1016    Clinical Impression Statement 81 yo female presents with reports of Rt knee instability and fear of walking/falling.  She has weakness through out the Rt LE, abnormal gait and quad tightness. These are interfering with her ability to participate in her walking program and daily activity.    Pt will benefit from skilled therapeutic intervention in order to improve on the following deficits Postural dysfunction;Decreased strength;Hypomobility;Pain;Abnormal gait;Decreased range of motion   Rehab Potential Good   PT Frequency 2x / week   PT Duration 4 weeks   PT Treatment/Interventions Cryotherapy;Electrical Stimulation;Moist Heat;Therapeutic  exercise;Manual techniques;Functional mobility training;Stair training;Gait training;Patient/family education;Passive range of motion;Neuromuscular re-education;Ultrasound   PT Next Visit Plan progress HEP if ready, LE strengthing and quad flexibility  Consulted and Agree with Plan of Care Patient         Problem List Patient Active Problem List   Diagnosis Date Noted  . Dupuytren's contracture of both hands 03/09/2015  . Left wrist pain 12/25/2014  . Subclinical hyperthyroidism 08/21/2014  . Diabetes mellitus type 2 in nonobese ([place]) 08/08/2014  . Essential hypertension, benign 08/08/2014  . Hyperlipidemia 08/08/2014  . Annual physical exam 08/08/2014  . Osteoarthritis of hand, left 07/11/2014  . Osteoarthritis of right knee 05/15/2014  . Right shoulder pain 05/15/2014  . Closed fracture of third, fourth, and fifth metacarpals of left hand 05/15/2014    Jeral Pinch PT 04/12/2015, 10:22 AM  [facility] [place], [place], [address] Phone: [phone]   Fax:  [phone]  Name: Lindsay Young MRN: [id number] Date of Birth: November 27, 1930

## 2015-04-17 ENCOUNTER — Ambulatory Visit (INDEPENDENT_AMBULATORY_CARE_PROVIDER_SITE_OTHER): Payer: Medicare Other | Admitting: Physical Therapy

## 2015-04-17 DIAGNOSIS — M25661 Stiffness of right knee, not elsewhere classified: Secondary | ICD-10-CM | POA: Diagnosis not present

## 2015-04-17 DIAGNOSIS — R531 Weakness: Secondary | ICD-10-CM

## 2015-04-17 DIAGNOSIS — R269 Unspecified abnormalities of gait and mobility: Secondary | ICD-10-CM | POA: Diagnosis not present

## 2015-04-17 NOTE — Therapy (Signed)
[facility], [place], [address] Phone: [phone]   Fax:  [phone]  Physical Therapy Treatment  Patient Details  Name: Lindsay Young MRN: [id number] Date of Birth: 18-Apr-1931 Referring Provider: Dr. Pollyann Glen  Encounter Date: 04/17/2015      PT End of Session - 04/17/15 1142    Visit Number 2   Number of Visits 8   Date for PT Re-Evaluation 05/10/15   PT Start Time 6256   PT Stop Time 1225   PT Time Calculation (min) 50 min   Activity Tolerance Patient tolerated treatment well      Past Medical History  Diagnosis Date  . Hypertension   . Diabetes mellitus without complication ([place])     No past surgical history on file.  There were no vitals filed for this visit.  Visit Diagnosis:  Weakness generalized  Abnormality of gait  Stiffness of knee joint, right      Subjective Assessment - 04/17/15 1143    Subjective Pt reports her Rt knee buckles about 3x/day, at random times.     Currently in Pain? No/denies            [facility] PT Assessment - 04/17/15 0001    Assessment   Medical Diagnosis Rt knee OA   Referring Provider Dr. Pollyann Glen   Onset Date/Surgical Date 03/13/15   Next MD Visit 05/08/15   Prior Therapy not for this           [facility] Adult PT Treatment/Exercise - 04/17/15 0001    Ambulation/Gait   Ambulation Distance (Feet) 110 Feet   Assistive device None   Gait Pattern Decreased arm swing - right;Decreased step length - left;Decreased dorsiflexion - right;Decreased dorsiflexion - left;Right flexed knee in stance   Gait velocity decreased   Gait Comments VC for heel strike bilat and decreased step length Rt. Improved post cue. Tactile cues to improve reciprocal arm swing during gait trials; Rt arm remained stiff with little carryover.     Knee/Hip Exercises: Stretches   Sports administrator 30 seconds;Right;Left;3 reps  prone with strap   Sports administrator Limitations  tighter Lt > Rt   Gastroc Stretch Right;Left;2 reps;30 seconds   Gastroc Stretch Limitations VC for holding stretch and no bouncing   Knee/Hip Exercises: Aerobic   Nustep L3: 5 min    Knee/Hip Exercises: Standing   Heel Raises Both;2 sets;10 reps   Heel Raises Limitations (toe raises x 20 reps)   Terminal Knee Extension Limitations RLE with red band x 10 reps with 5 sec hold; VC to slow pace and for form.    Step Down Right;5 reps;Hand Hold: 2  Step up forward/ down backward   Step Down Limitations Rt knee buckled upon attempt to step down (backwards) with both 6" and 3" steps  Stopped and performed TKE instead.    SLS 3 reps each leg x 15 sec with occasional UE suppor   Knee/Hip Exercises: Supine   Straight Leg Raises Strengthening;Right;2 sets;10 reps   Straight Leg Raises Limitations VC to keep knee straight   Knee/Hip Exercises: Sidelying   Hip ABduction Right;2 sets;10 reps   Hip ABduction Limitations VC for form   Knee/Hip Exercises: Prone   Straight Leg Raises Strengthening;Right;20 reps           PT Long Term Goals - 04/12/15 1018    PT LONG TERM GOAL #1   Title I with HEP ( 05/10/15)    Time 4  Period Weeks   Status New   PT LONG TERM GOAL #2   Title demo full Rt knee extension without pain ( 05/10/15)    Time 4   Period Weeks   Status New   PT LONG TERM GOAL #3   Title demo Rt knee strength =/> 5-/5 (05/10/15)    Time 4   Period Weeks   Status New   PT LONG TERM GOAL #4   Title demo Rt hip strength =/> 5-/5 ( 05/10/15)    Time 4   Period Weeks   Status New   PT LONG TERM GOAL #5   Title report =/> 50% improvement in feelings of instability with walking (05/10/15)    Time 4   Period Weeks   Status New   PT LONG TERM GOAL #6   Title improve FOTO =/< 43% limited CK level (05/10/15)    Time 4   Period Weeks   Status New               Plan - 04/17/15 1158    Clinical Impression Statement Pt required some tactile/ VC for technique/ form of  current and new exercises.  Pt had some discomfort with quad stretches and had buckling of Rt knee with attempts of stepping down 3 & 6" steps, otherwise tolerated all other exercises without increase in pain.      Pt will benefit from skilled therapeutic intervention in order to improve on the following deficits Postural dysfunction;Decreased strength;Hypomobility;Pain;Abnormal gait;Decreased range of motion   Rehab Potential Good   PT Frequency 2x / week   PT Duration 4 weeks   PT Treatment/Interventions Cryotherapy;Electrical Stimulation;Moist Heat;Therapeutic exercise;Manual techniques;Functional mobility training;Stair training;Gait training;Patient/family education;Passive range of motion;Neuromuscular re-education;Ultrasound   PT Next Visit Plan Continue progressive strengthening and stretching to RLE.  Assess Rt LE strength.    Consulted and Agree with Plan of Care Patient        Problem List Patient Active Problem List   Diagnosis Date Noted  . Dupuytren's contracture of both hands 03/09/2015  . Left wrist pain 12/25/2014  . Subclinical hyperthyroidism 08/21/2014  . Diabetes mellitus type 2 in nonobese ([place]) 08/08/2014  . Essential hypertension, benign 08/08/2014  . Hyperlipidemia 08/08/2014  . Annual physical exam 08/08/2014  . Osteoarthritis of hand, left 07/11/2014  . Osteoarthritis of right knee 05/15/2014  . Right shoulder pain 05/15/2014  . Closed fracture of third, fourth, and fifth metacarpals of left hand 05/15/2014    Kerin Perna, PTA 04/17/2015 12:48 PM  [place], [place], [address] Phone: [phone]   Fax:  [phone]  Name: Carlon Davidson MRN: [id number] Date of Birth: 20-Jun-1930

## 2015-04-17 NOTE — Patient Instructions (Signed)
Heel Raises    Stand with support. Tighten pelvic floor and hold. With knees straight, raise heels off ground. Repeat _10-20__ times. Do _1-2__ times a day.   Calf Stretch    Place hands on wall at shoulder height. Keeping back leg straight, bend front leg, feet pointing forward, heels flat on floor. Lean forward slightly until stretch is felt in calf of back leg. Hold stretch _30__ seconds, breathing slowly in and out. Repeat stretch with other leg back. Do _1__ sessions per day. Variation: Use chair or table for support.  Knee Extension: Terminal - Standing (Single Leg)    Face anchor in shoulder width stance, band around knee. Allow tension of band to slightly bend knee. Pull leg back, straightening knee. Repeat _10_ times per set. Repeat with other leg. Do 1__ sets per session.    [facility] [place], [place] [address]  [phone] (office) [phone] (fax)

## 2015-04-19 ENCOUNTER — Ambulatory Visit (INDEPENDENT_AMBULATORY_CARE_PROVIDER_SITE_OTHER): Payer: Medicare Other | Admitting: Physical Therapy

## 2015-04-19 DIAGNOSIS — R531 Weakness: Secondary | ICD-10-CM | POA: Diagnosis not present

## 2015-04-19 DIAGNOSIS — R269 Unspecified abnormalities of gait and mobility: Secondary | ICD-10-CM | POA: Diagnosis not present

## 2015-04-19 DIAGNOSIS — M25661 Stiffness of right knee, not elsewhere classified: Secondary | ICD-10-CM

## 2015-04-19 NOTE — Therapy (Signed)
[place], [place], [address] Phone: [phone]   Fax:  [phone]  Physical Therapy Treatment  Patient Details  Name: Lindsay Young MRN: [id number] Date of Birth: 07-15-1930 Referring Provider: Dr. Pollyann Glen  Encounter Date: 04/19/2015      PT End of Session - 04/19/15 1409    Visit Number 3   Number of Visits 8   Date for PT Re-Evaluation 05/10/15   PT Start Time 3570   PT Stop Time 1779   PT Time Calculation (min) 43 min      Past Medical History  Diagnosis Date  . Hypertension   . Diabetes mellitus without complication ([place])     No past surgical history on file.  There were no vitals filed for this visit.  Visit Diagnosis:  Weakness generalized  Abnormality of gait  Stiffness of knee joint, right      Subjective Assessment - 04/19/15 1408    Subjective Pt reports no new changes since last visit.  Still hasn't tried treadmill at home, "I'm not sure footed yet"    Currently in Pain? No/denies            [facility] PT Assessment - 04/19/15 0001    Assessment   Medical Diagnosis Rt knee OA   Onset Date/Surgical Date 03/13/15   Next MD Visit 05/08/15   Prior Therapy not for this   AROM   Right/Left Knee Right   Right Knee Extension -1  (after stretches)   Right Knee Flexion 134   Strength   Strength Assessment Site Knee;Hip   Right/Left Hip Right   Right Hip Flexion 4+/5   Right/Left Knee Right   Right Knee Flexion 4+/5   Right Knee Extension --  5-/5          [facility] Adult PT Treatment/Exercise - 04/19/15 0001    Knee/Hip Exercises: Stretches   Passive Hamstring Stretch Right;3 reps;30 seconds  supine with strap   Quad Stretch 30 seconds;Right;Left;3 reps  prone with strap   Gastroc Stretch Right;Left;2 reps;30 seconds   Knee/Hip Exercises: Aerobic   Nustep L3: 5 min    Knee/Hip Exercises: Standing   Heel Raises Both;1 set;10 reps   Terminal Knee  Extension Limitations RLE with green band x 10 reps with 5 sec hold   Wall Squat 1 set;10 reps   Knee/Hip Exercises: Seated   Long Arc Quad Left;2 sets;10 reps;Weights  focus on eccectric lowering   Long Arc Quad Weight 3 lbs.   Knee/Hip Exercises: Supine   Quad Sets 1 set;5 reps;Right   Quad Sets Limitations pt demonstrated poor quad contraction. complained of pain in post Rt knee with pressing leg into table.    Straight Leg Raises Right;Left;1 set;10 reps   Modalities   Modalities Cryotherapy   Cryotherapy   Number Minutes Cryotherapy --  Pt declined; will perform at home.            PT Long Term Goals - 04/12/15 1018    PT LONG TERM GOAL #1   Title I with HEP ( 05/10/15)    Time 4   Period Weeks   Status New   PT LONG TERM GOAL #2   Title demo full Rt knee extension without pain ( 05/10/15)    Time 4   Period Weeks   Status New   PT LONG TERM GOAL #3   Title demo Rt knee strength =/> 5-/5 (05/10/15)    Time 4  Period Weeks   Status New   PT LONG TERM GOAL #4   Title demo Rt hip strength =/> 5-/5 ( 05/10/15)    Time 4   Period Weeks   Status New   PT LONG TERM GOAL #5   Title report =/> 50% improvement in feelings of instability with walking (05/10/15)    Time 4   Period Weeks   Status New   PT LONG TERM GOAL #6   Title improve FOTO =/< 43% limited CK level (05/10/15)    Time 4   Period Weeks   Status New               Plan - 04/19/15 1433    Clinical Impression Statement Pt demonstrated improved Rt hip strength and ROM.  Pt demonstrated difficulty with good quad contraction with Rt LE, tightness in Rt hamstrings.  Progressing towards goals.    Pt will benefit from skilled therapeutic intervention in order to improve on the following deficits Postural dysfunction;Decreased strength;Hypomobility;Pain;Abnormal gait;Decreased range of motion   Rehab Potential Good   PT Frequency 2x / week   PT Duration 4 weeks   PT Treatment/Interventions  Cryotherapy;Electrical Stimulation;Moist Heat;Therapeutic exercise;Manual techniques;Functional mobility training;Stair training;Gait training;Patient/family education;Passive range of motion;Neuromuscular re-education;Ultrasound   PT Next Visit Plan Continue progressive strengthening and stretching to RLE.  Trial treadmill and 2-3" step training (eccentric) next visit if tolerated.         Problem List Patient Active Problem List   Diagnosis Date Noted  . Dupuytren's contracture of both hands 03/09/2015  . Left wrist pain 12/25/2014  . Subclinical hyperthyroidism 08/21/2014  . Diabetes mellitus type 2 in nonobese ([place]) 08/08/2014  . Essential hypertension, benign 08/08/2014  . Hyperlipidemia 08/08/2014  . Annual physical exam 08/08/2014  . Osteoarthritis of hand, left 07/11/2014  . Osteoarthritis of right knee 05/15/2014  . Right shoulder pain 05/15/2014  . Closed fracture of third, fourth, and fifth metacarpals of left hand 05/15/2014    Shelbie Hutching 04/19/2015, 4:04 PM  [facility] [place], [place], [address] Phone: [phone]   Fax:  [phone]  Name: Eiman Maret MRN: [id number] Date of Birth: 1931/05/27

## 2015-04-23 ENCOUNTER — Ambulatory Visit (INDEPENDENT_AMBULATORY_CARE_PROVIDER_SITE_OTHER): Payer: Medicare Other | Admitting: Physical Therapy

## 2015-04-23 DIAGNOSIS — M2550 Pain in unspecified joint: Secondary | ICD-10-CM | POA: Diagnosis not present

## 2015-04-23 DIAGNOSIS — M25661 Stiffness of right knee, not elsewhere classified: Secondary | ICD-10-CM

## 2015-04-23 DIAGNOSIS — R531 Weakness: Secondary | ICD-10-CM

## 2015-04-23 DIAGNOSIS — R269 Unspecified abnormalities of gait and mobility: Secondary | ICD-10-CM

## 2015-04-23 NOTE — Therapy (Signed)
[place], [place], [address] Phone: [phone]   Fax:  [phone]  Physical Therapy Treatment  Patient Details  Name: Lindsay Young MRN: [id number] Date of Birth: [birth year] Referring Provider: Dr. Pollyann Glen  Encounter Date: 04/23/2015      PT End of Session - 04/23/15 1028    Visit Number 4   Number of Visits 8   Date for PT Re-Evaluation 05/10/15   PT Start Time 1740   PT Stop Time 1104   PT Time Calculation (min) 46 min      Past Medical History  Diagnosis Date  . Hypertension   . Diabetes mellitus without complication ([place])     No past surgical history on file.  There were no vitals filed for this visit.  Visit Diagnosis:  Weakness generalized  Abnormality of gait  Pain in joint involving multiple sites  Stiffness of knee joint, right          [facility] PT Assessment - 04/23/15 0001    Assessment   Medical Diagnosis Rt knee OA   Referring Provider Dr. Pollyann Glen   Onset Date/Surgical Date 03/13/15   Next MD Visit 05/08/15           [facility] Adult PT Treatment/Exercise - 04/23/15 0001    Knee/Hip Exercises: Stretches   Passive Hamstring Stretch Right;3 reps;30 seconds  supine with strap   Quad Stretch 30 seconds;Right;3 reps  prone with strap   Gastroc Stretch Right;Left;2 reps;30 seconds   Knee/Hip Exercises: Aerobic   Tread Mill 5 min @1 .3-1.5 mph.    VC for increased Rt heel strike   Nustep L4: 5 min    Knee/Hip Exercises: Standing   Heel Raises Both;1 set;20 reps   Forward Step Up Right;1 set;10 reps;Hand Hold: 2  3" step   Forward Step Up Limitations 2nd set, 6" step x 10 reps.  No buckling    Knee/Hip Exercises: Seated   Long Arc Quad Right;1 set;10 reps;Weights   Long Arc Quad Weight 4 lbs.   Knee/Hip Exercises: Supine   Other Supine Knee/Hip Exercises PRONE:  TKE with toes x 3 sec hold, x 15 reps    Modalities   Modalities Ultrasound   Ultrasound   Ultrasound Location Posterior Rt knee (distal hamstrings)   Ultrasound Parameters 100%, 1.1 w/cm2, x 8 min    Ultrasound Goals Pain           PT Long Term Goals - 04/12/15 1018    PT LONG TERM GOAL #1   Title I with HEP ( 05/10/15)    Time 4   Period Weeks   Status New   PT LONG TERM GOAL #2   Title demo full Rt knee extension without pain ( 05/10/15)    Time 4   Period Weeks   Status New   PT LONG TERM GOAL #3   Title demo Rt knee strength =/> 5-/5 (05/10/15)    Time 4   Period Weeks   Status New   PT LONG TERM GOAL #4   Title demo Rt hip strength =/> 5-/5 ( 05/10/15)    Time 4   Period Weeks   Status New   PT LONG TERM GOAL #5   Title report =/> 50% improvement in feelings of instability with walking (05/10/15)    Time 4   Period Weeks   Status New   PT LONG TERM GOAL #6   Title improve FOTO =/< 43% limited CK level (05/10/15)  Time 4   Period Weeks   Status New               Plan - 04/23/15 1049    Clinical Impression Statement Pt continues with antalgic gait, Rt knee flexed in heel strike and stance phase; slight improvement with VC while on treadmill.  Pt point tender in Rt hamstrings distally.  Pt able to tolerate stair exercise without any buckling today.  Progressing towards goals.    Pt will benefit from skilled therapeutic intervention in order to improve on the following deficits Postural dysfunction;Decreased strength;Hypomobility;Pain;Abnormal gait;Decreased range of motion   Rehab Potential Good   PT Frequency 2x / week   PT Duration 4 weeks   PT Treatment/Interventions Cryotherapy;Electrical Stimulation;Moist Heat;Therapeutic exercise;Manual techniques;Functional mobility training;Stair training;Gait training;Patient/family education;Passive range of motion;Neuromuscular re-education;Ultrasound   PT Next Visit Plan Continue progressive strengthening and stretching to RLE.     PT Home Exercise Plan Pt to add short gait trials on  home treadmill (5-10 min)/ session.    Consulted and Agree with Plan of Care Patient        Problem List Patient Active Problem List   Diagnosis Date Noted  . Dupuytren's contracture of both hands 03/09/2015  . Left wrist pain 12/25/2014  . Subclinical hyperthyroidism 08/21/2014  . Diabetes mellitus type 2 in nonobese ([place]) 08/08/2014  . Essential hypertension, benign 08/08/2014  . Hyperlipidemia 08/08/2014  . Annual physical exam 08/08/2014  . Osteoarthritis of hand, left 07/11/2014  . Osteoarthritis of right knee 05/15/2014  . Right shoulder pain 05/15/2014  . Closed fracture of third, fourth, and fifth metacarpals of left hand 05/15/2014   Kerin Perna, PTA 04/23/2015 12:33 PM  [place], [place], [address] Phone: [phone]   Fax:  [phone]  Name: Lindsay Young MRN: [id number] Date of Birth: [birth year]

## 2015-04-26 ENCOUNTER — Ambulatory Visit (INDEPENDENT_AMBULATORY_CARE_PROVIDER_SITE_OTHER): Payer: Medicare Other | Admitting: Physical Therapy

## 2015-04-26 DIAGNOSIS — R531 Weakness: Secondary | ICD-10-CM

## 2015-04-26 DIAGNOSIS — M2550 Pain in unspecified joint: Secondary | ICD-10-CM

## 2015-04-26 DIAGNOSIS — R269 Unspecified abnormalities of gait and mobility: Secondary | ICD-10-CM

## 2015-04-26 DIAGNOSIS — M2560 Stiffness of unspecified joint, not elsewhere classified: Secondary | ICD-10-CM

## 2015-04-26 DIAGNOSIS — M25661 Stiffness of right knee, not elsewhere classified: Secondary | ICD-10-CM

## 2015-04-27 ENCOUNTER — Encounter: Payer: Self-pay | Admitting: Sports Medicine

## 2015-04-27 NOTE — Therapy (Signed)
[facility] [place] [place], [place], [address] Phone: [phone]   Fax:  [phone]  Physical Therapy Treatment  Patient Details  Name: Lindsay Young MRN: [id number] Date of Birth: [birth year] Referring Provider: Dr. Pollyann Glen  Encounter Date: 04/26/2015      PT End of Session - 04/27/15 0758    Visit Number 5   Number of Visits 8   Date for PT Re-Evaluation 05/10/15   PT Start Time [id number]   PT Stop Time 1531   PT Time Calculation (min) 46 min      Past Medical History  Diagnosis Date  . Hypertension   . Diabetes mellitus without complication ([place])     No past surgical history on file.  There were no vitals filed for this visit.  Visit Diagnosis:  Weakness generalized  Abnormality of gait  Pain in joint involving multiple sites  Stiffness of knee joint, right  Stiffness of joints, multiple sites      Subjective Assessment - 04/26/15 1459    Subjective Pt was on treadmill for 5 min each day.  She reports she is having less buckling with Rt knee.    Currently in Pain? Yes   Pain Score 3    Pain Location Knee   Pain Orientation Right   Pain Descriptors / Indicators Dull   Aggravating Factors  ?   Pain Relieving Factors ?            [facility] PT Assessment - 04/26/15 0001    Assessment   Medical Diagnosis Rt knee OA   Onset Date/Surgical Date 03/13/15   Next MD Visit 05/08/15   Strength   Strength Assessment Site Knee   Right/Left Knee Right   Right Knee Flexion 4+/5   Right Knee Extension --  5-/5           [facility] Adult PT Treatment/Exercise - 04/26/15 0001    Knee/Hip Exercises: Stretches   Passive Hamstring Stretch Right;3 reps;30 seconds  seated, straight back   Gastroc Stretch Right;Left;2 reps;30 seconds   Soleus Stretch Right;Left;2 reps;30 seconds   Knee/Hip Exercises: Aerobic   Tread Mill 5 min @ 1.6-1.9 mph    Knee/Hip Exercises: Standing   Lateral Step Up  Right;1 set;15 reps;Step Height: 6";Hand Hold: 2   Lateral Step Up Limitations (cross over step ups)   Step Down Right;2 sets   Step Down Limitations (3" x 10, 6" x 10) no buckling   SLS 3 reps each leg x 15 sec with occasional UE suppor   Modalities   Modalities Ultrasound   Ultrasound   Ultrasound Location Posterior Rt knee   Ultrasound Parameters 100%, 3.3 mHz, 1.1 w/cm2    Ultrasound Goals Pain   Manual Therapy   Manual Therapy Soft tissue mobilization   Soft tissue mobilization to posterior Rt hamstrings and proximal calf             PT Long Term Goals - 04/12/15 1018    PT LONG TERM GOAL #1   Title I with HEP ( 05/10/15)    Time 4   Period Weeks   Status New   PT LONG TERM GOAL #2   Title demo full Rt knee extension without pain ( 05/10/15)    Time 4   Period Weeks   Status New   PT LONG TERM GOAL #3   Title demo Rt knee strength =/> 5-/5 (05/10/15)    Time 4   Period Weeks   Status  New   PT LONG TERM GOAL #4   Title demo Rt hip strength =/> 5-/5 ( 05/10/15)    Time 4   Period Weeks   Status New   PT LONG TERM GOAL #5   Title report =/> 50% improvement in feelings of instability with walking (05/10/15)    Time 4   Period Weeks   Status New   PT LONG TERM GOAL #6   Title improve FOTO =/< 43% limited CK level (05/10/15)    Time 4   Period Weeks   Status New               Plan - 04/26/15 0744    Clinical Impression Statement Pt demonstrates improved Rt knee strength with less episodes of buckling.  Noted red irritated skin in posterior Rt knee, possibly from brace. Continues with point tenderness in posterior Rt knee with manual therapy and active knee extension.  Progressing towards goals.    Pt will benefit from skilled therapeutic intervention in order to improve on the following deficits Postural dysfunction;Decreased strength;Hypomobility;Pain;Abnormal gait;Decreased range of motion   Rehab Potential Good   PT Frequency 2x / week   PT  Duration 4 weeks   PT Treatment/Interventions Cryotherapy;Electrical Stimulation;Moist Heat;Therapeutic exercise;Manual techniques;Functional mobility training;Stair training;Gait training;Patient/family education;Passive range of motion;Neuromuscular re-education;Ultrasound   PT Home Exercise Plan Pt to wean out of knee brace to assist in reduced irritation in posterior knee.         Problem List Patient Active Problem List   Diagnosis Date Noted  . Dupuytren's contracture of both hands 03/09/2015  . Left wrist pain 12/25/2014  . Subclinical hyperthyroidism 08/21/2014  . Diabetes mellitus type 2 in nonobese ([place]) 08/08/2014  . Essential hypertension, benign 08/08/2014  . Hyperlipidemia 08/08/2014  . Annual physical exam 08/08/2014  . Osteoarthritis of hand, left 07/11/2014  . Osteoarthritis of right knee 05/15/2014  . Right shoulder pain 05/15/2014  . Closed fracture of third, fourth, and fifth metacarpals of left hand 05/15/2014    Kerin Perna, PTA 04/27/2015 7:59 AM  [place], [place], [address] Phone: [phone]   Fax:  [phone]  Name: Lindsay Young MRN: [id number] Date of Birth: 08-31-1930

## 2015-04-30 ENCOUNTER — Ambulatory Visit (INDEPENDENT_AMBULATORY_CARE_PROVIDER_SITE_OTHER): Payer: Medicare Other | Admitting: Physical Therapy

## 2015-04-30 ENCOUNTER — Telehealth: Payer: Self-pay | Admitting: Sports Medicine

## 2015-04-30 DIAGNOSIS — R531 Weakness: Secondary | ICD-10-CM | POA: Diagnosis not present

## 2015-04-30 DIAGNOSIS — M25661 Stiffness of right knee, not elsewhere classified: Secondary | ICD-10-CM

## 2015-04-30 DIAGNOSIS — R269 Unspecified abnormalities of gait and mobility: Secondary | ICD-10-CM

## 2015-04-30 MED ORDER — AMLODIPINE BESY-BENAZEPRIL HCL 5-40 MG PO CAPS: 1.0000 | ORAL_CAPSULE | Freq: Every day | ORAL | Status: AC

## 2015-04-30 NOTE — Addendum Note (Signed)
Addended by: Silverio Decamp on: 04/30/2015 04:51 PM   Modules accepted: Orders

## 2015-04-30 NOTE — Telephone Encounter (Signed)
Patient needs refill on norvasc tabs 5 mg tabs sent to [facility].  Her previous doctor (Dr. Horatio Pel) has retired.  thanks

## 2015-04-30 NOTE — Telephone Encounter (Signed)
Patient needs a refill sent

## 2015-04-30 NOTE — Therapy (Signed)
[place], [place], [address] Phone: [phone]   Fax:  [phone]  Physical Therapy Treatment  Patient Details  Name: Lindsay Young MRN: [id number] Date of Birth: [birth year] Referring Provider: Dr. Pollyann Glen  Encounter Date: 04/30/2015      PT End of Session - 04/30/15 1550    Visit Number 6   Number of Visits 8   Date for PT Re-Evaluation 05/10/15   PT Start Time 1501   PT Stop Time 1550   PT Time Calculation (min) 49 min   Activity Tolerance Patient tolerated treatment well;No increased pain      Past Medical History  Diagnosis Date  . Hypertension   . Diabetes mellitus without complication ([place])     No past surgical history on file.  There were no vitals filed for this visit.  Visit Diagnosis:  Weakness generalized  Abnormality of gait  Stiffness of knee joint, right      Subjective Assessment - 04/30/15 1517    Subjective Pt reports she has been walking on her treadmill 10 min / day at 2.0 mph.  Has not worn knee brace since 4 days ago, without difficulty.  "we're getting somewhere".    Currently in Pain? No/denies  just stiffness.            [facility] Adult PT Treatment/Exercise - 04/30/15 0001    Knee/Hip Exercises: Stretches   Passive Hamstring Stretch Right;3 reps;30 seconds  seated, straight back   Quad Stretch 30 seconds;Right;3 reps  prone with strap   Gastroc Stretch Right;Left;2 reps;30 seconds   Soleus Stretch Right;Left;2 reps;30 seconds   Knee/Hip Exercises: Aerobic   Tread Mill 3 min @ 2.1    Nustep L4: 5 min    Knee/Hip Exercises: Standing   Lateral Step Up Right;2 sets;10 reps;Step Height: 6";Hand Hold: 1   Forward Step Up Right;2 sets;10 reps;Hand Hold: 2;Step Height: 6"   Wall Squat 1 set;15 reps  with ball squeeze    Wall Squat Limitations VC and mirror for visual feedback (veers Lt on return   Knee/Hip Exercises: Seated   Long Arc Quad  Right;10 reps;Weights;2 sets   Long Arc Quad Weight 4 lbs.   Knee/Hip Exercises: Prone   Hamstring Curl 1 set;15 reps  4# at ankle.  challenging.   Ultrasound   Ultrasound Location Posterior Rt knee    Ultrasound Parameters 100%, 3.3 mHz, 1.1 w/cm2 x 8 min   Ultrasound Goals Pain  hamstring tightness   Manual Therapy   Manual therapy comments Pt point tender to palpation to posterior distal medial hamstring                      PT Long Term Goals - 04/30/15 1551    PT LONG TERM GOAL #1   Title I with HEP ( 05/10/15)    Time 4   Period Weeks   Status On-going   PT LONG TERM GOAL #2   Title demo full Rt knee extension without pain ( 05/10/15)    Time 4   Period Weeks   Status Partially Met  still reports pain with full ext   PT LONG TERM GOAL #3   Title demo Rt knee strength =/> 5-/5 (05/10/15)    Period Weeks   Status Partially Met   PT LONG TERM GOAL #4   Title demo Rt hip strength =/> 5-/5 ( 05/10/15)    Time 4   Period Weeks  Status On-going   PT LONG TERM GOAL #5   Title report =/> 50% improvement in feelings of instability with walking (05/10/15)    Time 4   Period Weeks   Status Achieved   PT LONG TERM GOAL #6   Title improve FOTO =/< 43% limited CK level (05/10/15)    Time 4   Period Weeks   Status On-going               Plan - 04/30/15 1552    Clinical Impression Statement Pt tolerated all exercises with minimal increase in pain.  Demonstrated improved Rt knee extension in stance phase of gait on treadmill. However pt continues to require VC to initiate TKE and incresaed weight shift Rt during ther ex. Pt  has met LTG #5, reporting improved  Rt knee stability with less episodes of buckling.    Pt will benefit from skilled therapeutic intervention in order to improve on the following deficits Postural dysfunction;Decreased strength;Hypomobility;Pain;Abnormal gait;Decreased range of motion   Rehab Potential Good   PT Frequency 2x / week    PT Duration 4 weeks   PT Treatment/Interventions Cryotherapy;Electrical Stimulation;Moist Heat;Therapeutic exercise;Manual techniques;Functional mobility training;Stair training;Gait training;Patient/family education;Passive range of motion;Neuromuscular re-education;Ultrasound   PT Next Visit Plan Continue progressive strengthening and stretching to RLE.  Test strength to RLE.    Consulted and Agree with Plan of Care Patient        Problem List Patient Active Problem List   Diagnosis Date Noted  . Dupuytren's contracture of both hands 03/09/2015  . Left wrist pain 12/25/2014  . Subclinical hyperthyroidism 08/21/2014  . Diabetes mellitus type 2 in nonobese ([place]) 08/08/2014  . Essential hypertension, benign 08/08/2014  . Hyperlipidemia 08/08/2014  . Annual physical exam 08/08/2014  . Osteoarthritis of hand, left 07/11/2014  . Osteoarthritis of right knee 05/15/2014  . Right shoulder pain 05/15/2014  . Closed fracture of third, fourth, and fifth metacarpals of left hand 05/15/2014   Kerin Perna, PTA 04/30/2015 3:56 PM  [place], [place], [address] Phone: [phone]   Fax:  [phone]  Name: Suella Cogar MRN: [id number] Date of Birth: 08/13/1930

## 2015-05-03 ENCOUNTER — Ambulatory Visit (INDEPENDENT_AMBULATORY_CARE_PROVIDER_SITE_OTHER): Payer: Medicare Other | Admitting: Physical Therapy

## 2015-05-03 DIAGNOSIS — M25661 Stiffness of right knee, not elsewhere classified: Secondary | ICD-10-CM

## 2015-05-03 DIAGNOSIS — R531 Weakness: Secondary | ICD-10-CM

## 2015-05-03 DIAGNOSIS — R269 Unspecified abnormalities of gait and mobility: Secondary | ICD-10-CM | POA: Diagnosis not present

## 2015-05-03 NOTE — Therapy (Signed)
[place], [place], [address] Phone: [phone]   Fax:  [phone]  Physical Therapy Treatment  Patient Details  Name: Lindsay Young MRN: [id number] Date of Birth: November 22, 1930 Referring Provider: Dr. Pollyann Glen  Encounter Date: 05/03/2015      PT End of Session - 05/03/15 1536    Visit Number 7   Number of Visits 8   Date for PT Re-Evaluation 05/10/15   PT Start Time 8032   PT Stop Time 1617   PT Time Calculation (min) 47 min   Activity Tolerance Patient tolerated treatment well      Past Medical History  Diagnosis Date  . Hypertension   . Diabetes mellitus without complication ([place])     No past surgical history on file.  There were no vitals filed for this visit.  Visit Diagnosis:  Weakness generalized  Abnormality of gait  Stiffness of knee joint, right      Subjective Assessment - 05/03/15 1538    Subjective Pt reports she was able to walk 17 min at 2.1 mph on treadmill at home, nearing what she had walked prior to injury (2.78mh x 25 min).  Pt reports she has had no episodes of buckling since last visit.    Currently in Pain? No/denies  just stiffness             [facility] - [facility]    Assessment   Medical Diagnosis Rt knee OA   Referring Provider Dr. TPollyann Glen  Onset Date/Surgical Date 03/13/15   Next MD Visit 05/08/15           [facility] 0001    Knee/Hip Exercises: Stretches   Passive Hamstring Stretch Right;3 reps;30 seconds;Left  seated, straight back   Quad Stretch 30 seconds;Right;3 reps  prone with strap   Gastroc Stretch Right;Left;2 reps;30 seconds   Soleus Stretch Right;Left;2 reps;30 seconds   Other Knee/Hip Stretches Overpressure into Rt knee extension x 30 sec x 3 reps    Knee/Hip Exercises: Aerobic   Nustep L4: 5 min    Knee/Hip Exercises: Standing   Lateral Step Up Right;2 sets;10  reps;Step Height: 6";Hand Hold: 1   Forward Step Up Right;2 sets;10 reps;Hand Hold: 2;Step Height: 6"   Knee/Hip Exercises: Supine   Straight Leg Raises Right;1 set;10 reps   Straight Leg Raises Limitations some extensor lag; VC for TKE prior to hip flexion   Modalities   Modalities Ultrasound   Ultrasound   Ultrasound Location Posterior Rt knee (distal hamstrings)   Ultrasound Parameters 100%, 1.0 mHz, 1.2 w/cm2 x 8 min    Ultrasound Goals --  tightness and soreness           PT Long Term Goals - 04/30/15 1551    PT LONG TERM GOAL #1   Title I with HEP ( 05/10/15)    Time 4   Period Weeks   Status On-going   PT LONG TERM GOAL #2   Title demo full Rt knee extension without pain ( 05/10/15)    Time 4   Period Weeks   Status Partially Met  still reports pain with full ext   PT LONG TERM GOAL #3   Title demo Rt knee strength =/> 5-/5 (05/10/15)    Period Weeks   Status Partially Met   PT LONG TERM GOAL #4   Title demo Rt hip strength =/> 5-/5 ( 05/10/15)    Time 4  Period Weeks   Status On-going   PT LONG TERM GOAL #5   Title report =/> 50% improvement in feelings of instability with walking (05/10/15)    Time 4   Period Weeks   Status Achieved   PT LONG TERM GOAL #6   Title improve FOTO =/< 43% limited CK level (05/10/15)    Time 4   Period Weeks   Status On-going               Plan - 05/03/15 1617    Clinical Impression Statement Pt continues with soreness with Rt knee extension. Demonstrates some extensor lag with SLR and with heel strike of gait.  Making great progress towards remaining goals.    Pt will benefit from skilled therapeutic intervention in order to improve on the following deficits Postural dysfunction;Decreased strength;Hypomobility;Pain;Abnormal gait;Decreased range of motion   Rehab Potential Good   PT Frequency 2x / week   PT Duration 4 weeks   PT Treatment/Interventions Cryotherapy;Electrical Stimulation;Moist Heat;Therapeutic  exercise;Manual techniques;Functional mobility training;Stair training;Gait training;Patient/family education;Passive range of motion;Neuromuscular re-education;Ultrasound   PT Next Visit Plan Continue progressive strengthening and stretching to RLE.  Test strength to RLE.    PT Home Exercise Plan Pt to trial increased speed to 2.2 mph for walking on treadmill, add seated hamstring stretch and continue current HEP.    Consulted and Agree with Plan of Care Patient        Problem List Patient Active Problem List   Diagnosis Date Noted  . Dupuytren's contracture of both hands 03/09/2015  . Left wrist pain 12/25/2014  . Subclinical hyperthyroidism 08/21/2014  . Diabetes mellitus type 2 in nonobese ([place]) 08/08/2014  . Essential hypertension, benign 08/08/2014  . Hyperlipidemia 08/08/2014  . Annual physical exam 08/08/2014  . Osteoarthritis of hand, left 07/11/2014  . Osteoarthritis of right knee 05/15/2014  . Right shoulder pain 05/15/2014  . Closed fracture of third, fourth, and fifth metacarpals of left hand 05/15/2014   Kerin Perna, PTA 05/03/2015 4:26 PM  [place], [place], [address] Phone: [phone]   Fax:  [phone]  Name: Lindsay Young MRN: [id number] Date of Birth: 05-09-1931

## 2015-05-04 ENCOUNTER — Other Ambulatory Visit: Payer: Self-pay | Admitting: Sports Medicine

## 2015-05-07 ENCOUNTER — Ambulatory Visit (INDEPENDENT_AMBULATORY_CARE_PROVIDER_SITE_OTHER): Payer: Medicare Other | Admitting: Physical Therapy

## 2015-05-07 DIAGNOSIS — M25661 Stiffness of right knee, not elsewhere classified: Secondary | ICD-10-CM

## 2015-05-07 DIAGNOSIS — R531 Weakness: Secondary | ICD-10-CM | POA: Diagnosis not present

## 2015-05-07 DIAGNOSIS — R269 Unspecified abnormalities of gait and mobility: Secondary | ICD-10-CM | POA: Diagnosis not present

## 2015-05-07 NOTE — Therapy (Signed)
[place], [place], [address] Phone: [phone]   Fax:  [phone]  Physical Therapy Treatment  Patient Details  Name: Lindsay Young MRN: [id number] Date of Birth: [birth year] Referring Provider: Dr. Pollyann Glen  Encounter Date: 05/07/2015      PT End of Session - 05/07/15 1408    Visit Number 8   Number of Visits 8   Date for PT Re-Evaluation 05/10/15   PT Start Time 6962   PT Stop Time 1436   PT Time Calculation (min) 31 min      Past Medical History  Diagnosis Date  . Hypertension   . Diabetes mellitus without complication ([place])     No past surgical history on file.  There were no vitals filed for this visit.  Visit Diagnosis:  Weakness generalized  Abnormality of gait  Stiffness of knee joint, right      Subjective Assessment - 05/07/15 1409    Subjective Pt reports she walked 10 min @2 .2. "That's about all I could do at that speed".  Pt reports she was very sore after last session, "I was limping for 2 days" reporting the back of Rt knee was painful.     Currently in Pain? No/denies  tender to touch.             [facility] PT Assessment - 05/07/15 0001    Assessment   Medical Diagnosis Rt knee OA   Referring Provider Dr. Pollyann Glen   Onset Date/Surgical Date 03/13/15   Next MD Visit 05/08/15   Observation/Other Assessments   Focus on Therapeutic Outcomes (FOTO)  43% limited, goal 43%.    AROM   Right Knee Extension -1   Right Knee Flexion 134   Strength   Strength Assessment Site Hip;Knee   Right Hip Flexion --  5-/5   Right Hip Extension 4+/5   Right Hip ABduction --  5-/5   Right/Left Knee Right   Right Knee Flexion --  5-/5   Right Knee Extension --  5-/5                     [facility] Adult PT Treatment/Exercise - 05/07/15 0001    Knee/Hip Exercises: Stretches   Passive Hamstring Stretch Right;Left;20 seconds;3 reps   Passive Hamstring  Stretch Limitations VC to slow down   Gastroc Stretch Right;Left;2 reps;30 seconds   Knee/Hip Exercises: Aerobic   Nustep L4: 5 min    Knee/Hip Exercises: Supine   Straight Leg Raises Strengthening;Right;1 set;5 reps   Straight Leg Raise with External Rotation Strengthening;Right;1 set;10 reps   Knee/Hip Exercises: Prone   Hamstring Curl 1 set;10 reps   Hip Extension Right;1 set;10 reps   Modalities   Modalities Ultrasound   Ultrasound   Ultrasound Location Posterior Rt knee    Ultrasound Parameters 100%, 1.2 w/cm2, x 8 min   Ultrasound Goals --  tightness and soreness                     PT Long Term Goals - 05/07/15 1526    PT LONG TERM GOAL #1   Title I with HEP ( 05/10/15)    Time 4   Period Weeks   Status Achieved   PT LONG TERM GOAL #2   Title demo full Rt knee extension without pain ( 05/10/15)    Time 4   Status Partially Met  still reports pain with TKE   PT LONG TERM GOAL #3  Title demo Rt knee strength =/> 5-/5 (05/10/15)    Time 4   Period Weeks   Status Achieved   PT LONG TERM GOAL #4   Title demo Rt hip strength =/> 5-/5 ( 05/10/15)    Time 4   Period Weeks   Status Partially Met   PT LONG TERM GOAL #5   Title report =/> 50% improvement in feelings of instability with walking (05/10/15)    Time 4   Period Weeks   Status Achieved   PT LONG TERM GOAL #6   Title improve FOTO =/< 43% limited CK level (05/10/15)    Time 4   Period Weeks   Status Achieved               Plan - 05/07/15 1531    Clinical Impression Statement Pt has partially met her goals; continues with pain in Rt knee with extension and is lacking 1-2 degrees of extension actively, however can reach 0 deg passively. Pt has had no episodes of Rt knee buckling since last visit and has been able to increase distance walking on treadmill again. Pt is satisfied with current level of function, and requests to d/c to HEP at this time.     Pt will benefit from skilled  therapeutic intervention in order to improve on the following deficits Postural dysfunction;Decreased strength;Hypomobility;Pain;Abnormal gait;Decreased range of motion   Rehab Potential Good   PT Frequency 2x / week   PT Duration 4 weeks   PT Treatment/Interventions Cryotherapy;Electrical Stimulation;Moist Heat;Therapeutic exercise;Manual techniques;Functional mobility training;Stair training;Gait training;Patient/family education;Passive range of motion;Neuromuscular re-education;Ultrasound   PT Next Visit Plan Spoke to supervising PT; will d/c to HEP.    Consulted and Agree with Plan of Care Patient;Family member/caregiver        Problem List Patient Active Problem List   Diagnosis Date Noted  . Dupuytren's contracture of both hands 03/09/2015  . Left wrist pain 12/25/2014  . Subclinical hyperthyroidism 08/21/2014  . Diabetes mellitus type 2 in nonobese ([place]) 08/08/2014  . Essential hypertension, benign 08/08/2014  . Hyperlipidemia 08/08/2014  . Annual physical exam 08/08/2014  . Osteoarthritis of hand, left 07/11/2014  . Osteoarthritis of right knee 05/15/2014  . Right shoulder pain 05/15/2014  . Closed fracture of third, fourth, and fifth metacarpals of left hand 05/15/2014    Lindsay Young, PTA 05/07/2015 4:05 PM  [place], [place], [address] Phone: [phone]   Fax:  [phone]  Name: Lindsay Young MRN: [id number] Date of Birth: 06-07-1931    PHYSICAL THERAPY DISCHARGE SUMMARY  Visits from Start of Care: 8  Current functional level related to goals / functional outcomes: Pt pleased with current level of function and feels confident in continuing HEP independently.    Remaining deficits: Some stiffness - needs to continue with HEP   Education / Equipment: HEP  Plan: Patient agrees to discharge.  Patient goals were partially met. Patient is being discharged due to  being pleased with the current functional level.  ?????    Celyn P. Helene Kelp PT, MPH 05/07/2015 5:02 PM

## 2015-05-08 ENCOUNTER — Ambulatory Visit (INDEPENDENT_AMBULATORY_CARE_PROVIDER_SITE_OTHER): Payer: Medicare Other | Admitting: Sports Medicine

## 2015-05-08 ENCOUNTER — Encounter: Payer: Self-pay | Admitting: Sports Medicine

## 2015-05-08 ENCOUNTER — Encounter: Payer: Medicare Other | Admitting: Rehabilitative and Restorative Service Providers"

## 2015-05-08 VITALS — BP 131/66 | HR 114 | Wt 127.0 lb

## 2015-05-08 DIAGNOSIS — M1711 Unilateral primary osteoarthritis, right knee: Secondary | ICD-10-CM

## 2015-05-08 NOTE — Progress Notes (Signed)
  Subjective:    CC: Follow-up  HPI: Hypertension: Unfortunately taking an extra dose of benazepril, educated on medication.  Right knee osteoarthritis: Still with mild pain along the medial joint line, she has had a steroid injection and aggressive physical therapy, very happy with how things are going so far but still has some pain.  Past medical history, Surgical history, Family history not pertinant except as noted below, Social history, Allergies, and medications have been entered into the medical record, reviewed, and no changes needed.   Review of Systems: No fevers, chills, night sweats, weight loss, chest pain, or shortness of breath.   Objective:    General: Well Developed, well nourished, and in no acute distress.  Neuro: Alert and oriented x3, extra-ocular muscles intact, sensation grossly intact.  HEENT: Normocephalic, atraumatic, pupils equal round reactive to light, neck supple, no masses, no lymphadenopathy, thyroid nonpalpable.  Skin: Warm and dry, no rashes. Cardiac: Regular rate and rhythm, no murmurs rubs or gallops, no lower extremity edema.  Respiratory: Clear to auscultation bilaterally. Not using accessory muscles, speaking in full sentences. Right Knee: Minimal swelling with a mild effusion and tenderness at the medial joint line ROM normal in flexion and extension and lower leg rotation. Ligaments with solid consistent endpoints including ACL, PCL, LCL, MCL. Negative Mcmurray's and provocative meniscal tests. Non painful patellar compression. Patellar and quadriceps tendons unremarkable. Hamstring and quadriceps strength is normal.  Procedure: Real-time Ultrasound Guided Injection of right knee Device: GE Logiq E  Verbal informed consent obtained.  Time-out conducted.  Noted no overlying erythema, induration, or other signs of local infection.  Skin prepped in a sterile fashion.  Local anesthesia: Topical Ethyl chloride.  With sterile technique and under  real time ultrasound guidance:   30 mg/2 mL of OrthoVisc (sodium hyaluronate) in a prefilled syringe was injected easily into the knee through a 22-gauge needle. Completed without difficulty  Pain immediately resolved suggesting accurate placement of the medication.  Advised to call if fevers/chills, erythema, induration, drainage, or persistent bleeding.  Images permanently stored and available for review in the ultrasound unit.  Impression: Technically successful ultrasound guided injection.  Impression and Recommendations:

## 2015-05-08 NOTE — Assessment & Plan Note (Signed)
Fantastic response to steroid injection and aggressive formal physical therapy, mild persistent pain at the medial joint line, starting Orthovisc injections. Return in one week for #2 of 4.

## 2015-05-15 ENCOUNTER — Ambulatory Visit: Payer: Medicare Other | Admitting: Sports Medicine

## 2015-05-16 ENCOUNTER — Ambulatory Visit (INDEPENDENT_AMBULATORY_CARE_PROVIDER_SITE_OTHER): Payer: Medicare Other | Admitting: Sports Medicine

## 2015-05-16 ENCOUNTER — Encounter: Payer: Self-pay | Admitting: Sports Medicine

## 2015-05-16 VITALS — BP 107/58 | HR 120

## 2015-05-16 DIAGNOSIS — M1711 Unilateral primary osteoarthritis, right knee: Secondary | ICD-10-CM

## 2015-05-16 NOTE — Progress Notes (Signed)

## 2015-05-16 NOTE — Assessment & Plan Note (Signed)
Orthovisc injection #2 of 4 into the right knee, return in one week for #3. 

## 2015-05-23 ENCOUNTER — Ambulatory Visit: Payer: Medicare Other | Admitting: Sports Medicine

## 2015-05-24 ENCOUNTER — Ambulatory Visit (INDEPENDENT_AMBULATORY_CARE_PROVIDER_SITE_OTHER): Payer: Medicare Other | Admitting: Sports Medicine

## 2015-05-24 VITALS — BP 127/67 | HR 89 | Temp 98.4°F | Resp 18 | Wt 126.7 lb

## 2015-05-24 DIAGNOSIS — M1711 Unilateral primary osteoarthritis, right knee: Secondary | ICD-10-CM | POA: Diagnosis not present

## 2015-05-24 NOTE — Assessment & Plan Note (Signed)
Orthovisc injection #3 of 4 as above, return in one week for #4.

## 2015-05-24 NOTE — Progress Notes (Signed)

## 2015-05-30 ENCOUNTER — Ambulatory Visit: Payer: Medicare Other | Admitting: Sports Medicine

## 2015-05-31 ENCOUNTER — Ambulatory Visit (INDEPENDENT_AMBULATORY_CARE_PROVIDER_SITE_OTHER): Payer: Medicare Other | Admitting: Sports Medicine

## 2015-05-31 ENCOUNTER — Encounter: Payer: Self-pay | Admitting: Sports Medicine

## 2015-05-31 VITALS — BP 129/66 | HR 103 | Temp 97.9°F | Resp 18 | Wt 127.0 lb

## 2015-05-31 DIAGNOSIS — M1711 Unilateral primary osteoarthritis, right knee: Secondary | ICD-10-CM

## 2015-05-31 NOTE — Assessment & Plan Note (Signed)
OrthoVisc injection number 4, pain free, return as needed.

## 2015-05-31 NOTE — Progress Notes (Signed)

## 2015-07-03 ENCOUNTER — Ambulatory Visit (INDEPENDENT_AMBULATORY_CARE_PROVIDER_SITE_OTHER): Payer: Medicare Other

## 2015-07-03 ENCOUNTER — Ambulatory Visit (INDEPENDENT_AMBULATORY_CARE_PROVIDER_SITE_OTHER): Payer: Medicare Other | Admitting: Sports Medicine

## 2015-07-03 VITALS — BP 109/62 | HR 101 | Temp 97.8°F | Resp 18 | Wt 123.4 lb

## 2015-07-03 DIAGNOSIS — D491 Neoplasm of unspecified behavior of respiratory system: Secondary | ICD-10-CM | POA: Insufficient documentation

## 2015-07-03 DIAGNOSIS — J209 Acute bronchitis, unspecified: Secondary | ICD-10-CM | POA: Diagnosis not present

## 2015-07-03 DIAGNOSIS — E119 Type 2 diabetes mellitus without complications: Secondary | ICD-10-CM | POA: Diagnosis not present

## 2015-07-03 DIAGNOSIS — J449 Chronic obstructive pulmonary disease, unspecified: Secondary | ICD-10-CM | POA: Diagnosis not present

## 2015-07-03 DIAGNOSIS — D382 Neoplasm of uncertain behavior of pleura: Secondary | ICD-10-CM

## 2015-07-03 DIAGNOSIS — M25511 Pain in right shoulder: Secondary | ICD-10-CM | POA: Diagnosis not present

## 2015-07-03 MED ORDER — ACETAMINOPHEN-CODEINE 120-12 MG/5ML PO SUSP: 5.0000 mL | Freq: Four times a day (QID) | ORAL | Status: AC | PRN

## 2015-07-03 MED ORDER — AZITHROMYCIN 250 MG PO TABS: ORAL_TABLET | ORAL | Status: DC

## 2015-07-03 MED ORDER — GABAPENTIN 100 MG PO CAPS: 100.0000 mg | ORAL_CAPSULE | Freq: Every day | ORAL | Status: DC

## 2015-07-03 NOTE — Assessment & Plan Note (Signed)
Has overall done well on her oral medications, she is developing some burning in the legs at night likely related to diabetic peripheral neuropathy, adding low-dose gabapentin at bedtime.

## 2015-07-03 NOTE — Assessment & Plan Note (Signed)
Subacromial injection, physical therapy. Return in one month.

## 2015-07-03 NOTE — Progress Notes (Signed)
  Subjective:    CC:  Cough and right shoulder pain  HPI: For just over 2 weeks this pleasant 82 year old female has had a worsening cough, minimally productive without shortness of breath, wheezing, constitutional symptoms. No chest pain.  Right shoulder pain: Moderate, persistent, localized over the deltoid and worse with overhead activities, no trauma. No radiation past the elbow.  Past medical history, Surgical history, Family history not pertinant except as noted below, Social history, Allergies, and medications have been entered into the medical record, reviewed, and no changes needed.   Review of Systems: No fevers, chills, night sweats, weight loss, chest pain, or shortness of breath.   Objective:    General: Well Developed, well nourished, and in no acute distress.  Neuro: Alert and oriented x3, extra-ocular muscles intact, sensation grossly intact.  HEENT: Normocephalic, atraumatic, pupils equal round reactive to light, neck supple, no masses, no lymphadenopathy, thyroid nonpalpable.  Skin: Warm and dry, no rashes. Cardiac: Regular rate and rhythm, no murmurs rubs or gallops, no lower extremity edema.  Respiratory: Clear to auscultation bilaterally. Not using accessory muscles, speaking in full sentences. Right Shoulder: Inspection reveals no abnormalities, atrophy or asymmetry. Palpation is normal with no tenderness over AC joint or bicipital groove. ROM is full in all planes. Rotator cuff strength normal throughout. positive Neer and Hawkin's tests, empty can. Speeds and Yergason's tests normal. No labral pathology noted with negative Obrien's, negative crank, negative clunk, and good stability. Normal scapular function observed. No painful arc and no drop arm sign. No apprehension sign  Procedure: Real-time Ultrasound Guided Injection of right subacromial bursa Device: GE Logiq E  Verbal informed consent obtained.  Time-out conducted.  Noted no overlying erythema,  induration, or other signs of local infection.  Skin prepped in a sterile fashion.  Local anesthesia: Topical Ethyl chloride.  With sterile technique and under real time ultrasound guidance:  1 mL kenalog 40 , 3 mL lidocaine injected easily into the subacromial space. Completed without difficulty  Pain immediately resolved suggesting accurate placement of the medication.  Advised to call if fevers/chills, erythema, induration, drainage, or persistent bleeding.  Images permanently stored and available for review in the ultrasound unit.  Impression: Technically successful ultrasound guided injection.  Impression and Recommendations:

## 2015-07-03 NOTE — Assessment & Plan Note (Addendum)
Symptoms present for over 2 weeks, azithromycin, codeine syrup.  X-rays show an extensive pleural-based mass, subsequent CT scan with IV contrast is suggestive of pleural carcinomatosis with enlarged hilar lymph nodes. On further questioning patient has had extensive weight loss area This was discussed on the phone with the patient and her husband, and we will proceed with pulmonology referral for assistance in further management.

## 2015-07-04 ENCOUNTER — Other Ambulatory Visit: Payer: Self-pay | Admitting: Sports Medicine

## 2015-07-05 LAB — T4, FREE: Free T4: 1.24 ng/dL (ref 0.80–1.80)

## 2015-07-05 LAB — T3, FREE: T3, Free: 2.6 pg/mL (ref 2.3–4.2)

## 2015-07-06 LAB — BASIC METABOLIC PANEL WITH GFR
BUN: 29 mg/dL — ABNORMAL HIGH (ref 7–25)
CO2: 21 mmol/L (ref 20–31)
Chloride: 97 mmol/L — ABNORMAL LOW (ref 98–110)
Creat: 1.45 mg/dL — ABNORMAL HIGH (ref 0.60–0.88)
Glucose, Bld: 224 mg/dL — ABNORMAL HIGH (ref 65–99)

## 2015-07-06 LAB — BASIC METABOLIC PANEL
Calcium: 10.4 mg/dL (ref 8.6–10.4)
Potassium: 5.2 mmol/L (ref 3.5–5.3)
Sodium: 134 mmol/L — ABNORMAL LOW (ref 135–146)

## 2015-07-10 ENCOUNTER — Encounter: Payer: Self-pay | Admitting: Physical Therapy

## 2015-07-10 ENCOUNTER — Ambulatory Visit (INDEPENDENT_AMBULATORY_CARE_PROVIDER_SITE_OTHER): Payer: Medicare Other | Admitting: Physical Therapy

## 2015-07-10 DIAGNOSIS — M25611 Stiffness of right shoulder, not elsewhere classified: Secondary | ICD-10-CM | POA: Diagnosis not present

## 2015-07-10 DIAGNOSIS — R293 Abnormal posture: Secondary | ICD-10-CM

## 2015-07-10 DIAGNOSIS — M25511 Pain in right shoulder: Secondary | ICD-10-CM | POA: Diagnosis not present

## 2015-07-10 DIAGNOSIS — R29898 Other symptoms and signs involving the musculoskeletal system: Secondary | ICD-10-CM | POA: Diagnosis not present

## 2015-07-10 NOTE — Patient Instructions (Signed)
Resisted External Rotation: in Neutral - Bilateral stand up tall against a wall    [place] [phone]    Sit or stand, tubing in both hands, elbows at sides, bent to 90, forearms forward. Pinch shoulder blades together and rotate forearms out. Keep elbows at sides. Repeat __10__ times per set. Do _2-3___ sets per session. Do __1__ sessions per day.    Strengthening: Chest Pull - Resisted  Lean against the wall.     With resistive band looped around each hand, and arms straight out in front, stretch band across chest. Keep back on wall and chest lifted Repeat _10___ times per set. Do _2-3___ sets per session. Do __1__ sessions per day.  Scapula Adduction With Pectorals, Low   Stand in doorframe with palms against frame and arms at 45. Lean forward and squeeze shoulder blades. Hold _30__ seconds. Repeat __1_ times per session. Do _2__ sessions per day.  Scapula Adduction With Pectorals, Mid-Range   Stand in doorframe with palms against frame and arms at 90. Lean forward and squeeze shoulder blades. Hold _30__ seconds. Repeat __1_ times per session. Do _2__ sessions per day.  Copyright  VHI. All rights reserved.

## 2015-07-10 NOTE — Therapy (Signed)
[facility] [place], [place], [address] Phone: [phone]   Fax:  [phone]  Physical Therapy Evaluation  Patient Details  Name: Lindsay Young MRN: [id number] Date of Birth: [birth year] Referring Provider: Dr Dianah Field  Encounter Date: 07/10/2015      PT End of Session - 07/10/15 1047    Visit Number 1   Number of Visits 12   Date for PT Re-Evaluation 08/21/15   PT Start Time 1007   PT Stop Time 1048   PT Time Calculation (min) 41 min   Activity Tolerance Patient limited by fatigue      Past Medical History  Diagnosis Date  . Hypertension   . Diabetes mellitus without complication ([place])     History reviewed. No pertinent past surgical history.  There were no vitals filed for this visit.  Visit Diagnosis:  Abnormal posture - Plan: PT plan of care cert/re-cert  Pain in joint of right shoulder - Plan: PT plan of care cert/re-cert  Weakness of right arm - Plan: PT plan of care cert/re-cert  Stiffness of shoulder joint, right - Plan: PT plan of care cert/re-cert      Subjective Assessment - 07/10/15 1023    Subjective Pt reports her Rt shoulder started hurting about 2 wks ago, wasn't able to lay on her side.  Had an injection last Tuesday,was a lot better however now the pain is starting to return.    Pertinent History recovering from bronchitis   Diagnostic tests none of the shoulder   Patient Stated Goals get rid of shoulder pain,    Currently in Pain? Yes   Pain Score 2    Pain Location Shoulder   Pain Orientation Right;Posterior   Pain Descriptors / Indicators Aching   Pain Onset 1 to 4 weeks ago   Pain Frequency Intermittent   Aggravating Factors  lying on her side   Pain Relieving Factors injections.             [facility] PT Assessment - 07/10/15 0001    Assessment   Medical Diagnosis Rt shoulder pain   Referring Provider Dr Dianah Field   Onset Date/Surgical Date 06/26/15    Hand Dominance Right   Next MD Visit 07/31/15   Prior Therapy not for this   Precautions   Precautions None   Balance Screen   Has the patient fallen in the past 6 months No   Has the patient had a decrease in activity level because of a fear of falling?  No   Is the patient reluctant to leave their home because of a fear of falling?  No   Home Ecologist residence   Living Arrangements Spouse/significant other   Prior Function   Level of [place] Retired   Leisure read, attend concerts   Observation/Other Assessments   Focus on Therapeutic Outcomes (FOTO)  35% limited   Posture/Postural Control   Posture/Postural Control Postural limitations   Postural Limitations Rounded Shoulders;Forward head;Increased thoracic kyphosis  very significant thoracic kyphosis   ROM / Strength   AROM / PROM / Strength AROM;Strength   AROM   AROM Assessment Site Shoulder   Right/Left Shoulder Right  Lt shoulder with similair limitations   Right Shoulder Extension 63 Degrees   Right Shoulder Flexion 120 Degrees   Right Shoulder ABduction 115 Degrees   Right Shoulder Internal Rotation 72 Degrees  tender   Right Shoulder External Rotation 84 Degrees  Strength   Strength Assessment Site Shoulder   Right/Left Shoulder Right  Lt shoulder grossly 5-/5   Right Shoulder Flexion 5/5   Right Shoulder ABduction 4/5   Right Shoulder Internal Rotation 4+/5   Right Shoulder External Rotation 4-/5   Flexibility   Soft Tissue Assessment /Muscle Length --  tight pecs bilat.    Palpation   Palpation comment very tender Rt post shoulder, acromion process and pecs.    Special Tests    Special Tests Rotator Cuff Impingement   Rotator Cuff Impingment tests Michel Bickers test   Hawkins-Kennedy test   Findings Positive   Side Right                   [facility] Adult PT Treatment/Exercise - 07/10/15 0001    Self-Care   Self-Care Posture   discussed importance of upright posture with shoudler issues   Exercises   Exercises Shoulder required VC and physical ques for there ex   Shoulder Exercises: Supine   External Rotation Strengthening;Both;20 reps;Theraband   Theraband Level (Shoulder External Rotation) Level 1 (Yellow)   ABduction Strengthening;Both;20 reps;Theraband  horizontal   Theraband Level (Shoulder ABduction) Level 1 (Yellow)   Shoulder Exercises: Stretch   Other Shoulder Stretches doorway, low and mid x30sec                PT Education - 07/10/15 1044    Education provided Yes   Education Details HEP, initial posture education   Person(s) Educated Patient   Methods Explanation;Demonstration;Handout   Comprehension Returned demonstration;Verbalized understanding;Need further instruction             PT Long Term Goals - 07/10/15 1054    PT LONG TERM GOAL #1   Title I with HEP ( 08/21/15)    Time 6   Period Weeks   Status New   PT LONG TERM GOAL #2   Title increase Rt shoulder flexion =/> 145 degrees without pain ( 08/21/15)    Time 6   Period Weeks   Status New   PT LONG TERM GOAL #3   Title will stand with upright posture without prompting ( 08/21/15)    Time 6   Period Weeks   Status New   PT LONG TERM GOAL #4   Title increase strength Rt shoulder =/> 5-/5 ( 08/21/15)    Time 6   Period Weeks   Status New   PT LONG TERM GOAL #5   Title improve FOTO =/< 27% limited ( 08/21/15)    Time 6   Period Weeks   Status New               Plan - 07/10/15 1053    Clinical Impression Statement 82 yo female with significant postural changes and now having Rt shoulder pain. Her ROM is limited and she has lost some strength and ROM   Pt will benefit from skilled therapeutic intervention in order to improve on the following deficits Postural dysfunction;Decreased strength;Pain;Impaired UE functional use;Decreased range of motion   Rehab Potential Good   PT Frequency 2x / week   PT Duration  6 weeks   PT Treatment/Interventions Ultrasound;Neuromuscular re-education;Patient/family education;Passive range of motion;Dry needling;Cryotherapy;Electrical Stimulation;Moist Heat;Therapeutic exercise;Manual techniques   PT Next Visit Plan postural re-ed   Consulted and Agree with Plan of Care Patient         Problem List Patient Active Problem List   Diagnosis Date Noted  . Acute bronchitis 07/03/2015  . Dupuytren's contracture  of both hands 03/09/2015  . Left wrist pain 12/25/2014  . Subclinical hyperthyroidism 08/21/2014  . Diabetes mellitus type 2 in nonobese ([place]) 08/08/2014  . Essential hypertension, benign 08/08/2014  . Hyperlipidemia 08/08/2014  . Annual physical exam 08/08/2014  . Osteoarthritis of hand, left 07/11/2014  . Osteoarthritis of right knee 05/15/2014  . Right shoulder pain 05/15/2014  . Closed fracture of third, fourth, and fifth metacarpals of left hand 05/15/2014    Jeral Pinch PT  07/10/2015, 11:00 AM  [facility] [address] [place], [place], [address] Phone: [phone]   Fax:  [phone]  Name: Lindsay Young MRN: [id number] Date of Birth: 1930-11-14

## 2015-07-13 ENCOUNTER — Ambulatory Visit (HOSPITAL_BASED_OUTPATIENT_CLINIC_OR_DEPARTMENT_OTHER)
Admission: RE | Admit: 2015-07-13 | Discharge: 2015-07-13 | Disposition: A | Discharge status: Home / Self Care | Payer: Medicare Other | Source: Ambulatory Visit | Attending: Sports Medicine | Admitting: Sports Medicine

## 2015-07-13 ENCOUNTER — Ambulatory Visit (INDEPENDENT_AMBULATORY_CARE_PROVIDER_SITE_OTHER): Payer: Medicare Other | Admitting: Physical Therapy

## 2015-07-13 ENCOUNTER — Encounter (HOSPITAL_BASED_OUTPATIENT_CLINIC_OR_DEPARTMENT_OTHER): Payer: Self-pay

## 2015-07-13 DIAGNOSIS — M25511 Pain in right shoulder: Secondary | ICD-10-CM | POA: Diagnosis not present

## 2015-07-13 DIAGNOSIS — R590 Localized enlarged lymph nodes: Secondary | ICD-10-CM | POA: Insufficient documentation

## 2015-07-13 DIAGNOSIS — J90 Pleural effusion, not elsewhere classified: Secondary | ICD-10-CM | POA: Insufficient documentation

## 2015-07-13 DIAGNOSIS — R293 Abnormal posture: Secondary | ICD-10-CM

## 2015-07-13 DIAGNOSIS — M25611 Stiffness of right shoulder, not elsewhere classified: Secondary | ICD-10-CM | POA: Diagnosis not present

## 2015-07-13 DIAGNOSIS — J209 Acute bronchitis, unspecified: Secondary | ICD-10-CM | POA: Insufficient documentation

## 2015-07-13 DIAGNOSIS — R29898 Other symptoms and signs involving the musculoskeletal system: Secondary | ICD-10-CM

## 2015-07-13 DIAGNOSIS — N200 Calculus of kidney: Secondary | ICD-10-CM | POA: Diagnosis not present

## 2015-07-13 DIAGNOSIS — R918 Other nonspecific abnormal finding of lung field: Secondary | ICD-10-CM | POA: Diagnosis not present

## 2015-07-13 MED ORDER — IOHEXOL 300 MG/ML  SOLN
60.0000 mL | Freq: Once | INTRAMUSCULAR | Status: AC | PRN
  Administered 2015-07-13: 60 mL via INTRAVENOUS

## 2015-07-13 NOTE — Therapy (Addendum)
[facility] [place] [address] [place], [place], [address] Phone: [phone]   Fax:  [phone]  Physical Therapy Treatment  Patient Details  Name: Lindsay Young MRN: [id number] Date of Birth: Oct 18, 1930 Referring Provider: Dr Benjamin Stain  Encounter Date: 07/13/2015      PT End of Session - 07/13/15 1406    Visit Number 2   Number of Visits 12   Date for PT Re-Evaluation 08/21/15   PT Start Time 1403   PT Stop Time 1454   PT Time Calculation (min) 51 min   Activity Tolerance --  some increased pain with exercise      Past Medical History  Diagnosis Date  . Hypertension   . Diabetes mellitus without complication ([facility])     No past surgical history on file.  There were no vitals filed for this visit.  Visit Diagnosis:  Abnormal posture  Pain in joint of right shoulder  Weakness of right arm  Stiffness of shoulder joint, right      Subjective Assessment - 07/13/15 1406    Subjective Pt reports her Rt shoulder pain comes and goes.  Nothing new to report. She states she's been compliant with HEP.    Currently in Pain? No/denies            [facility] PT Assessment - 07/13/15 0001    Assessment   Medical Diagnosis Rt shoulder pain   Onset Date/Surgical Date 06/26/15   Hand Dominance Right   Next MD Visit 07/31/15   Prior Therapy not for this          [facility] Adult PT Treatment/Exercise - 07/13/15 0001    Shoulder Exercises: Supine   Horizontal ABduction Both;10 reps;Theraband   Theraband Level (Shoulder Horizontal ABduction) Level 1 (Yellow)   Flexion Both;10 reps  overhead pull with yellow band   Other Supine Exercises sash with yellow band x 10 reps x 2 sets each arm    Shoulder Exercises: Standing   Horizontal ABduction Strengthening;Both;10 reps;Theraband  2 sets   Theraband Level (Shoulder Horizontal ABduction) Level 1 (Yellow)   External Rotation Strengthening;Both  3 sets of 10. back against  pool noodle   Other Standing Exercises wall wash with Rt arm to increase flexion x 10 reps    Shoulder Exercises: Pulleys   Flexion 2 minutes;Other (comment)   Shoulder Exercises: Stretch   Other Shoulder Stretches doorway, low and mid x30sec  caused pain, switched to snow angel in supine   Other Shoulder Stretches Snow angel in supine 30-60 sec x 4 reps (for pec stretch ).    Modalities   Modalities Moist Heat   Moist Heat Therapy   Number Minutes Moist Heat 12 Minutes   Moist Heat Location Shoulder  Rt   Manual Therapy   Manual Therapy Soft tissue mobilization   Soft tissue mobilization to Rt pec, periscapular musculature, infraspinatus                      PT Long Term Goals - 07/10/15 1054    PT LONG TERM GOAL #1   Title I with HEP ( 08/21/15)    Time 6   Period Weeks   Status New   PT LONG TERM GOAL #2   Title increase Rt shoulder flexion =/> 145 degrees without pain ( 08/21/15)    Time 6   Period Weeks   Status New   PT LONG TERM GOAL #3   Title will stand with upright  posture without prompting ( 08/21/15)    Time 6   Period Weeks   Status New   PT LONG TERM GOAL #4   Title increase strength Rt shoulder =/> 5-/5 ( 08/21/15)    Time 6   Period Weeks   Status New   PT LONG TERM GOAL #5   Title improve FOTO =/< 27% limited ( 08/21/15)    Time 6   Period Weeks   Status New               Problem List Patient Active Problem List   Diagnosis Date Noted  . Acute bronchitis 07/03/2015  . Dupuytren's contracture of both hands 03/09/2015  . Left wrist pain 12/25/2014  . Subclinical hyperthyroidism 08/21/2014  . Diabetes mellitus type 2 in nonobese ([place]) 08/08/2014  . Essential hypertension, benign 08/08/2014  . Hyperlipidemia 08/08/2014  . Annual physical exam 08/08/2014  . Osteoarthritis of hand, left 07/11/2014  . Osteoarthritis of right knee 05/15/2014  . Right shoulder pain 05/15/2014  . Closed fracture of third, fourth, and fifth metacarpals  of left hand 05/15/2014    Kerin Perna, PTA 07/13/2015 4:50 PM  [facility] [place], [place], [address] Phone: [phone]   Fax:  [phone]  Name: Lindsay Young MRN: [id number] Date of Birth: Dec 23, 1930    PHYSICAL THERAPY DISCHARGE SUMMARY  Visits from Start of Care: 2  Current functional level related to goals / functional outcomes: See above   Remaining deficits: See above   Education / Equipment: HEP  Plan: Patient agrees to discharge.  Patient goals were not met. Patient is being discharged due to a change in medical status. Patient told us she has been diagnosed with lung cancer ?????   Jeral Pinch, PT 07/25/2015 10:19 AM

## 2015-07-13 NOTE — Addendum Note (Signed)
Addended by: Silverio Decamp on: 07/13/2015 05:18 PM   Modules accepted: Orders

## 2015-07-18 ENCOUNTER — Encounter: Payer: Self-pay | Admitting: Pulmonary Disease

## 2015-07-18 ENCOUNTER — Ambulatory Visit (INDEPENDENT_AMBULATORY_CARE_PROVIDER_SITE_OTHER): Payer: Medicare Other | Admitting: Pulmonary Disease

## 2015-07-18 VITALS — BP 106/58 | HR 108 | Ht 66.0 in | Wt 120.0 lb

## 2015-07-18 DIAGNOSIS — D491 Neoplasm of unspecified behavior of respiratory system: Secondary | ICD-10-CM

## 2015-07-18 DIAGNOSIS — D382 Neoplasm of uncertain behavior of pleura: Secondary | ICD-10-CM
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
## 2015-07-18 NOTE — Patient Instructions (Signed)
We discussed possibility of lung cancer You will need PET scan Biopsy Oncology appt We will try to set up at [facility]

## 2015-07-18 NOTE — Assessment & Plan Note (Addendum)
We discussed possibility of lung cancer-CT appearance is very concerning for pleural metastases or primary cancer such as mesothelioma You will need PET scan Biopsy Oncology appt After further discussion, patient and husband preferred to have further care that [facility] since they live closer to there. We will refer to oncology at [facility]-hopefully she can get her biopsy done before she sees them and a PET scan done there as well  The various options of biopsy including bronchoscopy, CT guided needle aspiration and surgical biopsy were discussed.The risks of each procedure including coughing, bleeding and the  chances of lung puncture requiring chest tube were discussed in great detail. The benefits & alternatives including serial follow up were also discussed.

## 2015-07-19 ENCOUNTER — Encounter: Payer: Medicare Other | Admitting: Physical Therapy

## 2015-07-31 ENCOUNTER — Ambulatory Visit: Payer: Medicare Other | Admitting: Sports Medicine

## 2015-08-06 ENCOUNTER — Telehealth: Payer: Self-pay

## 2015-08-06 DIAGNOSIS — E119 Type 2 diabetes mellitus without complications: Secondary | ICD-10-CM

## 2015-08-06 MED ORDER — GABAPENTIN 300 MG PO CAPS: ORAL_CAPSULE | ORAL | Status: AC

## 2015-08-06 NOTE — Telephone Encounter (Signed)
Increase to 300 mg 2-3 times per day, I will call this in.

## 2015-08-06 NOTE — Telephone Encounter (Signed)
Patient advised of recommendations.  

## 2015-08-06 NOTE — Telephone Encounter (Signed)
Zyan states the gabapentin 100 mg is not helping with the burning in her feet. She states she needs an increase in the gabapentin. Please advise.    FYI  She will start chemo on Friday.

## 2015-08-21 ENCOUNTER — Encounter: Payer: Self-pay | Admitting: Sports Medicine

## 2015-09-17 ENCOUNTER — Other Ambulatory Visit: Payer: Self-pay | Admitting: Sports Medicine

## 2015-10-05 ENCOUNTER — Telehealth: Payer: Self-pay

## 2015-10-05 MED ORDER — GLUCOSE BLOOD VI STRP: ORAL_STRIP | Status: AC

## 2015-10-05 NOTE — Telephone Encounter (Signed)
I think it'd be fine to refill.

## 2015-11-08 ENCOUNTER — Telehealth: Payer: Self-pay | Admitting: Sports Medicine

## 2015-11-08 NOTE — Telephone Encounter (Signed)
Yes, she has metastatic stage IV non-small cell lung cancer currently on palliative chemotherapy. I'm happy to follow along.

## 2015-11-08 NOTE — Telephone Encounter (Signed)
Received phone call from [place] with [facility] [phone]) regarding an order for Hospice. Pt is being discharged from hospital [facility]) today/tomorrow and an order request was placed for hospice to come into the home. Hospice wants to know if PCP will follow the Pt and if her prognosis is 6 months or less. Will route.

## 2015-11-08 NOTE — Telephone Encounter (Signed)
Notified Debbie with Hospice.

## 2015-12-15 DEATH — deceased

## 2016-03-06 IMAGING — CR DG KNEE COMPLETE 4+V*R*
4 series · 4 of 4 positions shown · non-contrast
Comparison: None.

CLINICAL DATA: Anterior knee pain status post fall yesterday

EXAM:
RIGHT KNEE - COMPLETE 4+ VIEW

[view not recorded (1 of 4)]
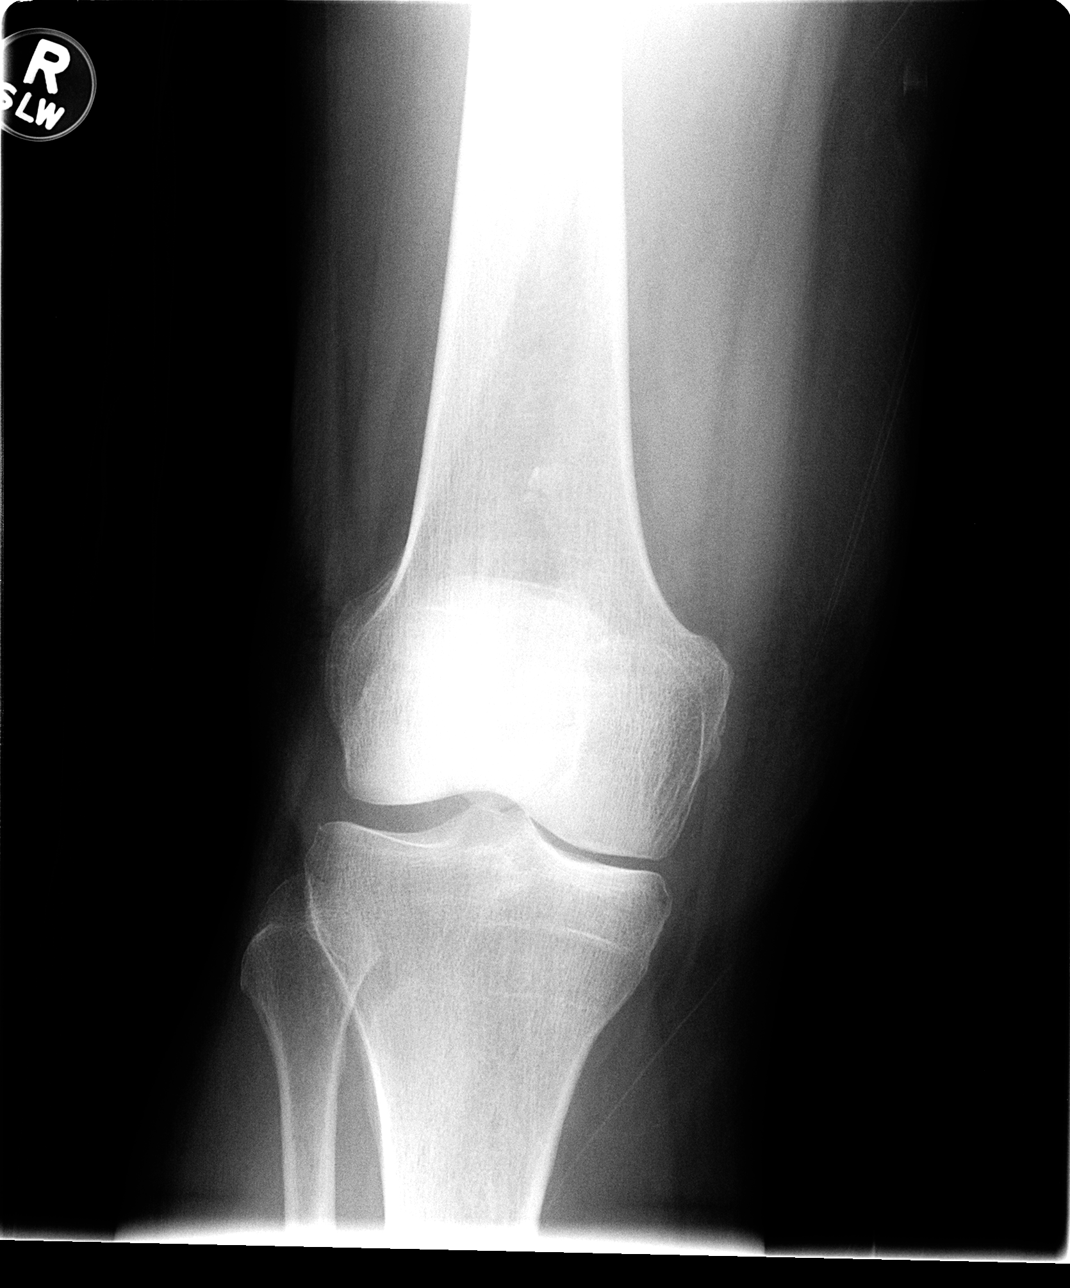

[view not recorded (2 of 4)]
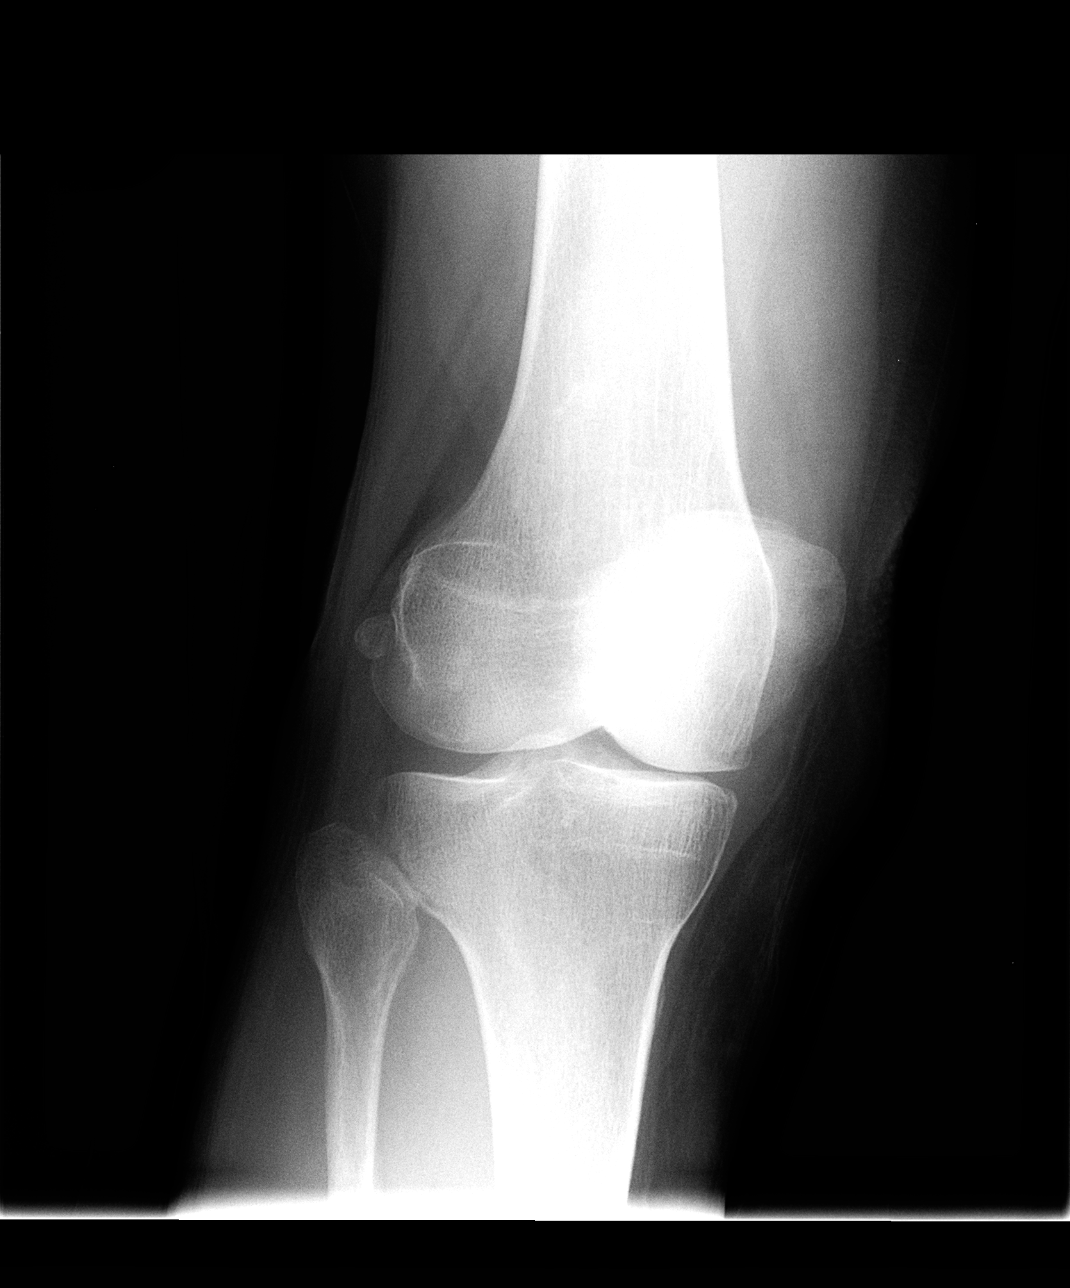

[view not recorded (3 of 4)]
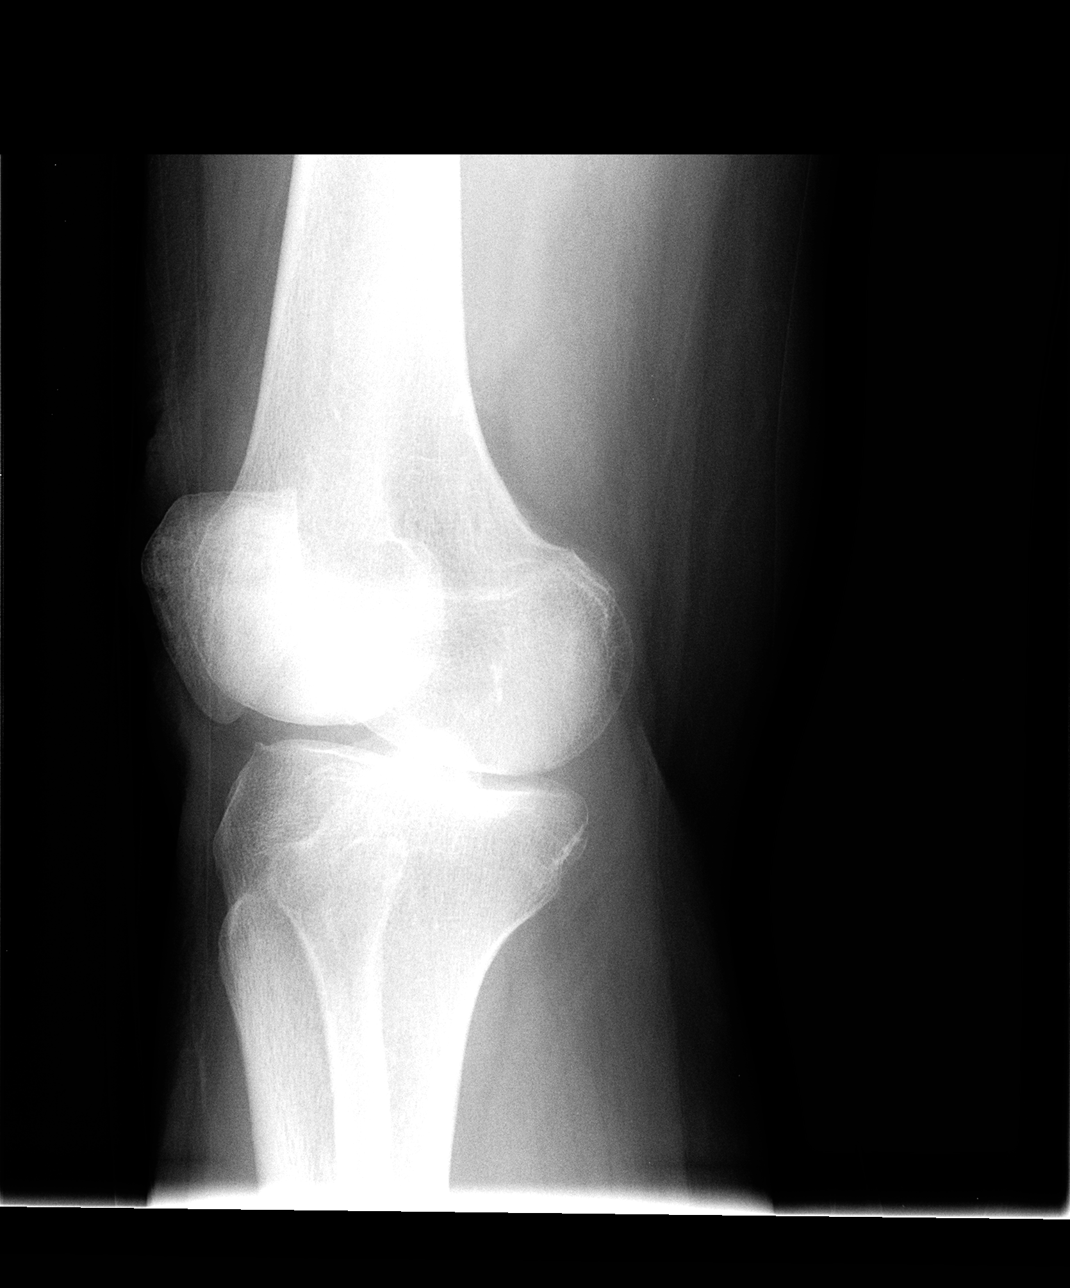

[view not recorded (4 of 4)]
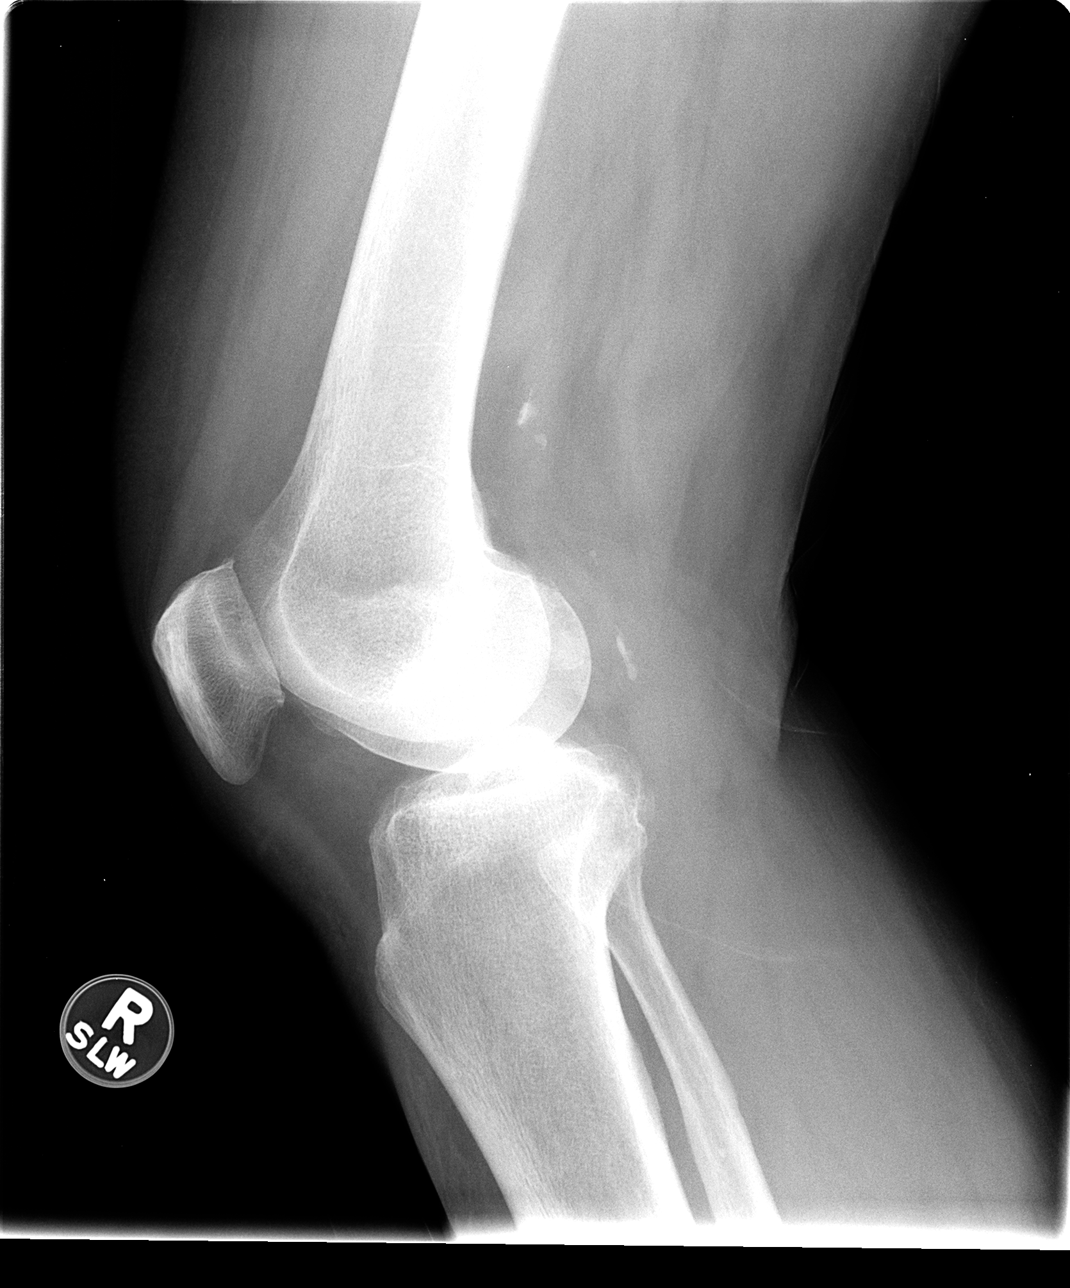

[4 of 4 positions shown; findings below may reference images not displayed]

FINDINGS: The bones of the right knee are adequately mineralized. There is
mild narrowing of the medial joint compartment. The femoral condyles
and the tibial plateaus are intact. Are small spurs from the
superior and inferior margins of the patella. There is no joint
effusion. There is calcification in the wall of the popliteal
artery.
IMPRESSION: There is no acute bony abnormality of the right knee.

## 2016-03-06 IMAGING — CR DG HAND COMPLETE 3+V*L*
3 series · 3 of 3 positions shown · non-contrast
Comparison: None.

CLINICAL DATA: Left hand pain with bruising and swelling over the
second through fifth metacarpals status post fall yesterday.

EXAM:
LEFT HAND - COMPLETE 3+ VIEW

[view not recorded (1 of 3)]
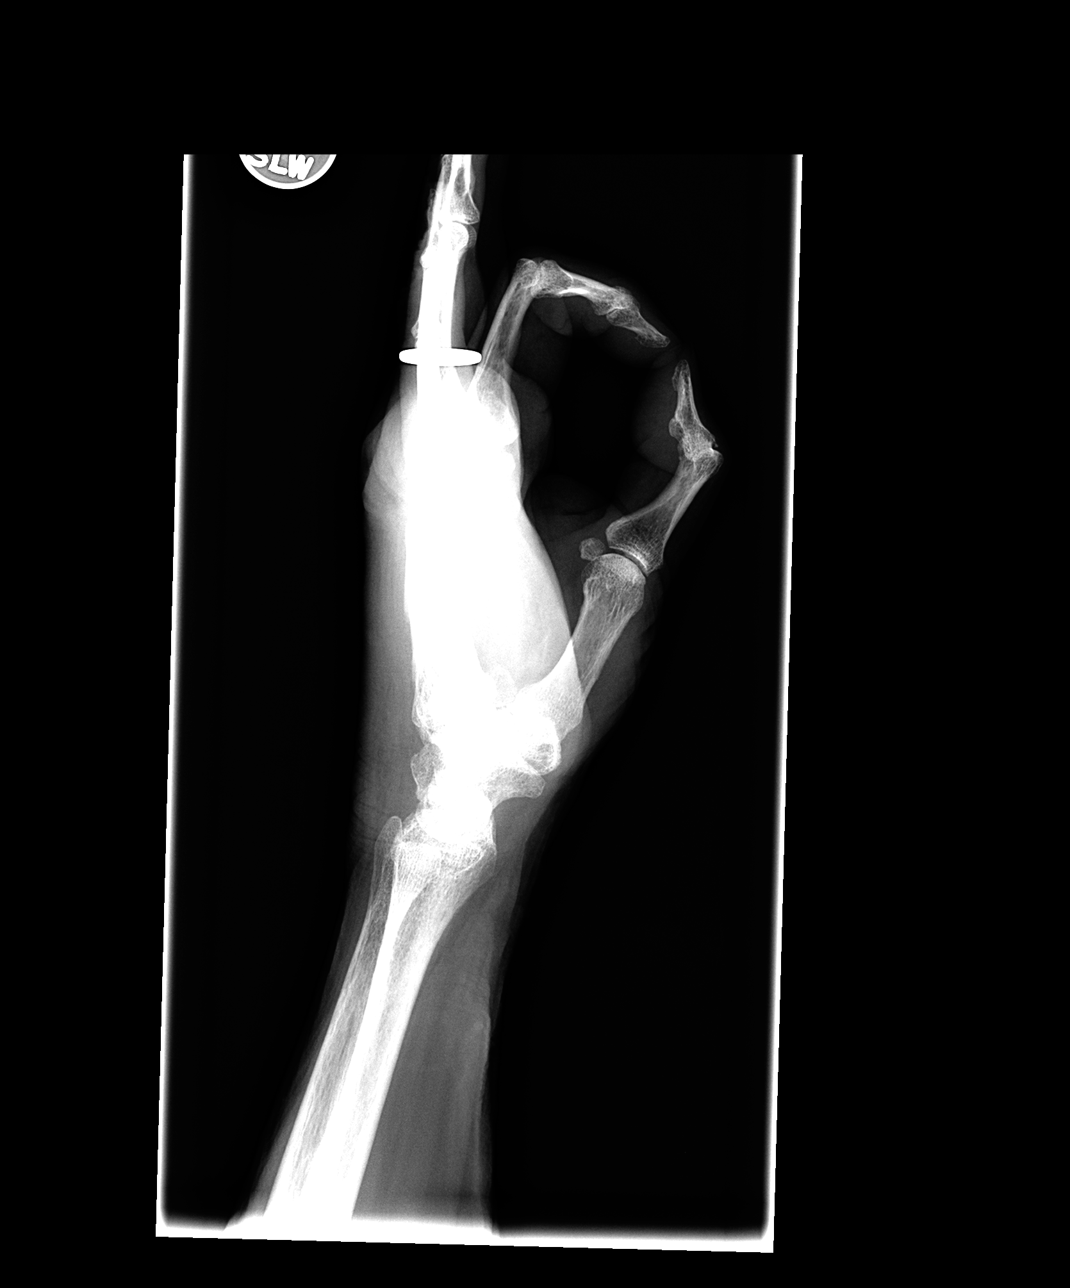

[view not recorded (2 of 3)]
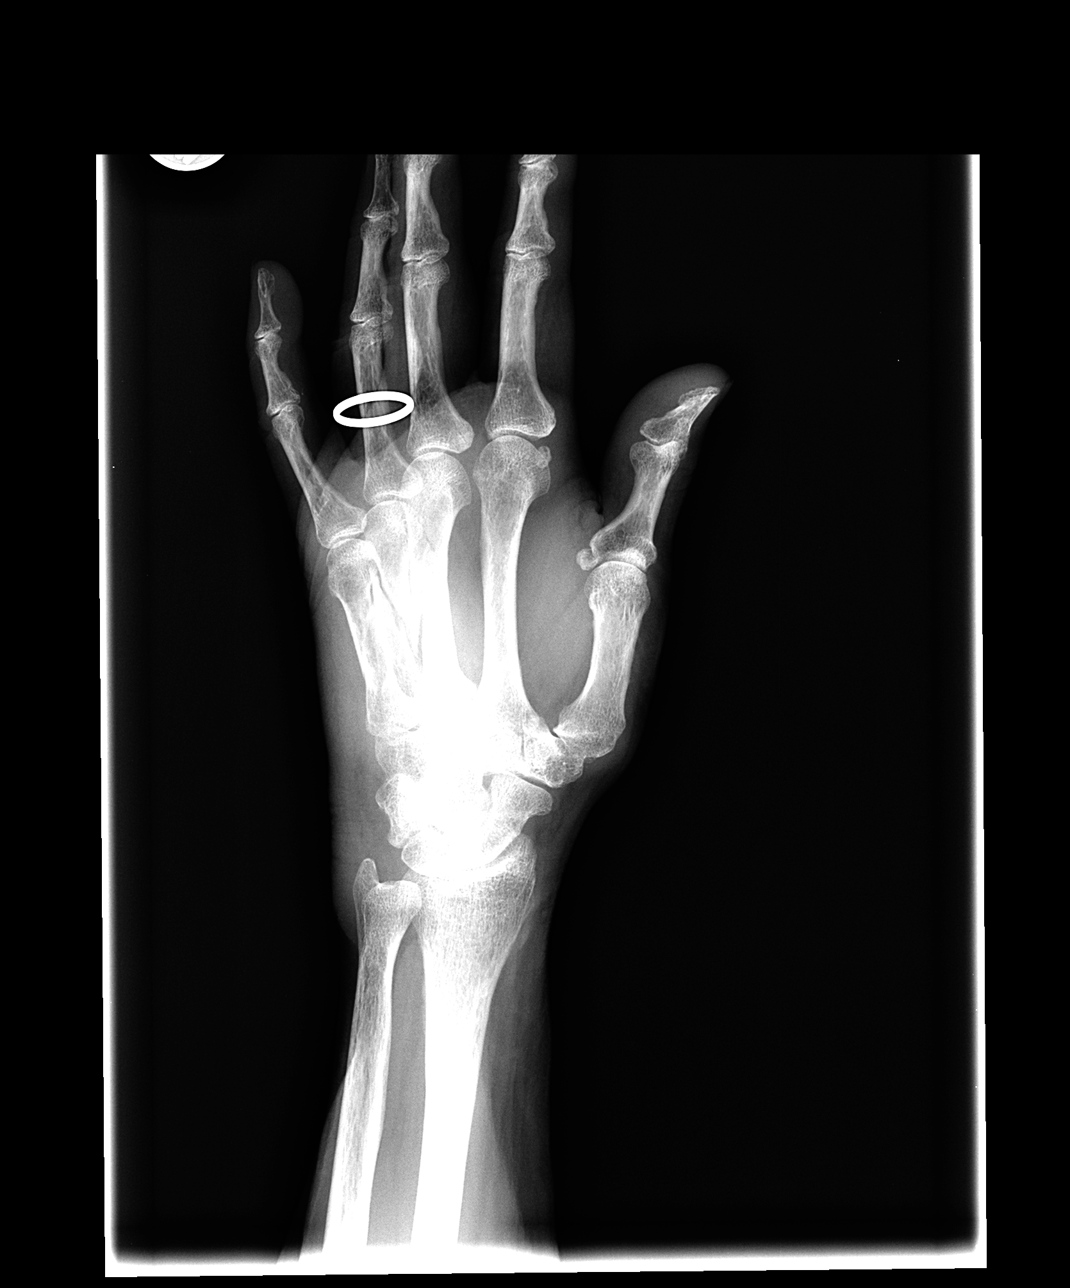

[view not recorded (3 of 3)]
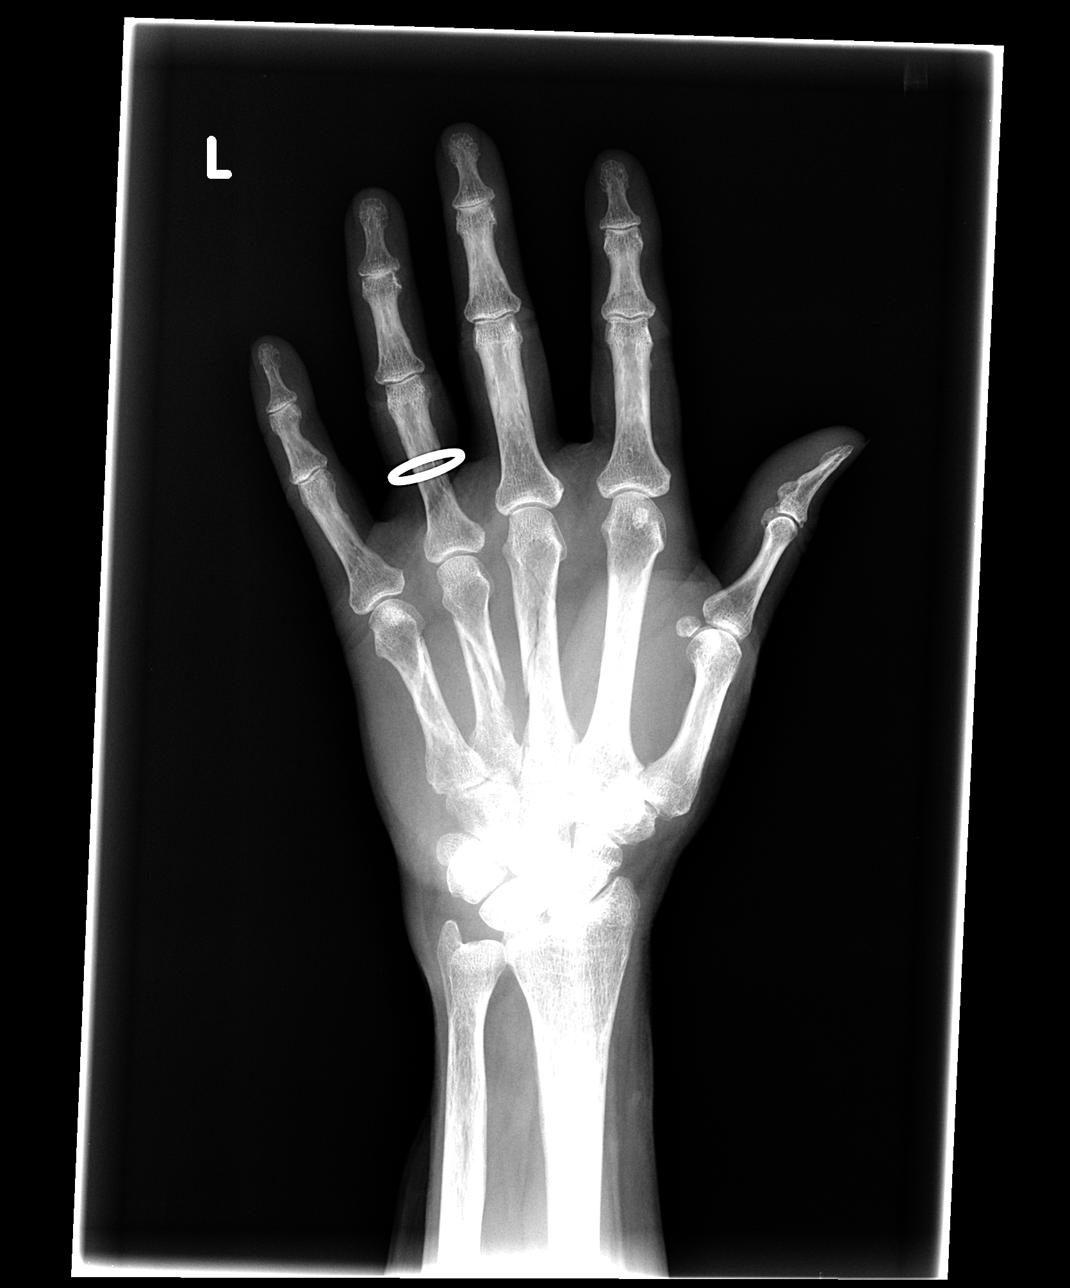

[3 of 3 positions shown; findings below may reference images not displayed]

FINDINGS: The patient has sustained spiral fractures of the third, fourth, and
fifth metacarpals. There is mild displacement of the fourth
metacarpal shaft fracture. The first and second metacarpals are
intact. The phalanges are intact as are the carpal bones and
visualized portions of the distal radius and ulna. There is
extensive soft tissue swelling over the metacarpals. There is
narrowing of the joint spaces diffusely consistent with mild
osteoarthritis. There is moderate degenerative change of the first
carpometacarpal joint.
IMPRESSION: There are acute spiral fractures of the shafts of the third, fourth,
and fifth metacarpals.
